# Patient Record
Sex: Female | Born: 1990
Health system: Southern US, Community
[De-identification: ages and names within clinical notes are randomized; demographics above are authoritative.]

## PROBLEM LIST (undated history)

## (undated) ENCOUNTER — Emergency Department (HOSPITAL_COMMUNITY): Admission: EM | Payer: Self-pay | Source: Home / Self Care

## (undated) DIAGNOSIS — E282 Polycystic ovarian syndrome: Secondary | ICD-10-CM

## (undated) DIAGNOSIS — C4491 Basal cell carcinoma of skin, unspecified: Secondary | ICD-10-CM

## (undated) HISTORY — DX: Polycystic ovarian syndrome: E28.2

## (undated) HISTORY — DX: Basal cell carcinoma of skin, unspecified: C44.91

---

## 2012-12-20 HISTORY — PX: BASAL CELL CARCINOMA EXCISION: SHX1214

## 2015-04-09 ENCOUNTER — Ambulatory Visit (INDEPENDENT_AMBULATORY_CARE_PROVIDER_SITE_OTHER): Admitting: Allergy and Immunology

## 2015-04-09 ENCOUNTER — Encounter: Payer: Self-pay | Admitting: Allergy and Immunology

## 2015-04-09 VITALS — BP 120/76 | HR 52 | Temp 97.9°F | Resp 12 | Ht 62.4 in | Wt 128.7 lb

## 2015-04-09 DIAGNOSIS — K219 Gastro-esophageal reflux disease without esophagitis: Secondary | ICD-10-CM

## 2015-04-09 DIAGNOSIS — J387 Other diseases of larynx: Secondary | ICD-10-CM | POA: Diagnosis not present

## 2015-04-09 DIAGNOSIS — J3089 Other allergic rhinitis: Secondary | ICD-10-CM

## 2015-04-09 MED ORDER — MONTELUKAST SODIUM 10 MG PO TABS: 10.0000 mg | ORAL_TABLET | Freq: Every day | ORAL | Status: DC

## 2015-04-09 MED ORDER — OMEPRAZOLE 40 MG PO CPDR: DELAYED_RELEASE_CAPSULE | ORAL | Status: DC

## 2015-04-09 MED ORDER — METHYLPREDNISOLONE ACETATE 80 MG/ML IJ SUSP
80.0000 mg | Freq: Once | INTRAMUSCULAR | Status: AC
  Administered 2015-04-09: 80 mg via INTRAMUSCULAR

## 2015-04-09 MED ORDER — RANITIDINE HCL 300 MG PO TABS: 300.0000 mg | ORAL_TABLET | Freq: Every day | ORAL | Status: DC

## 2015-04-09 NOTE — Progress Notes (Signed)
[place]    NEW PATIENT NOTE    Subjective:   Patient ID: April Woodard is a 24 y.o. female with a chief complaint of Sinus Problem  and the following problems:  HPI Comments:  April Woodard presents to this clinic with complaints of postnasal drip. She has a history of developing problems with her sinuses back many years. She underwent evaluation with an allergist back in 2011 was skin tested and found to be allergic to multiple allergens. At that point in time she had lots of cough and postnasal drip and some nasal issues. She apparently had a CT scan of her sinuses at that point in time which identified a left maxillary cyst. About 3 times a year she'll develop recurrent problems with some head congestion and postnasal drip and throat clearing. In August 2016 she's had persistent problems with postnasal drip and throat clearing something sticky stuff stuck in her throat and intermittent raspy voice. During one week she developed green nasal discharge. She did visit with Dr. Redmond Baseman who gave her some amoxicillin for 2 weeks ago which did help some of her green nasal discharge and maybe helps her cough slightly but really did not help her postnasal drip. She does not note any obvious provoking factor giving rise to these problems. She denies any classic reflux symptoms. She might have had reflux treatment back in 2011 which she's not sure really helped her. She has been using some over-the-counter Rhinocort and Claritin-D on occasion but rarely.   History reviewed. No pertinent past medical history.  History reviewed. No pertinent past surgical history.   Current outpatient prescriptions:  .  Loratadine-Pseudoephedrine (CLARITIN-D 24 HOUR PO), Take by mouth as needed., Disp: , Rfl:  .  Multiple Vitamin (MULTIVITAMIN) tablet, Take 1 tablet by mouth daily., Disp: , Rfl:   No orders of the defined types were placed in this encounter.     No Known Allergies  Review of Systems  Constitutional: Negative for fever, chills, weight loss and malaise/fatigue.  HENT: Negative for congestion, ear discharge, ear pain, hearing loss, nosebleeds, sore throat and tinnitus.   Eyes: Negative for blurred vision, pain, discharge and redness.  Respiratory: Negative for cough, hemoptysis, sputum production, shortness of breath, wheezing and stridor.   Cardiovascular: Negative for chest pain and leg swelling.  Gastrointestinal: Negative for heartburn, nausea, vomiting, abdominal pain and diarrhea.  Musculoskeletal: Negative for myalgias and joint pain.  Skin: Negative for itching and rash.  Neurological: Negative for dizziness and headaches.    Family History  Problem Relation Age of Onset  . Asthma Mother   . Multiple sclerosis Mother   . Allergic rhinitis Mother     Social History   Social History  . Marital Status: Single    Spouse Name: N/A  . Number of Children: N/A  . Years of Education: N/A   Occupational History  . Not on file.   Social History Main Topics  . Smoking status: Never Smoker   . Smokeless tobacco: Never Used  . Alcohol Use: Yes  . Drug Use: Not on file  . Sexual Activity: Not on file   Other Topics Concern  . Not on file   Social History Narrative  . No narrative on file    Environmental and Social history  April Woodard lives in an apartment with a slightly damp environment, no animals located inside the household, no carpeting in the bedroom, no smokers located inside the household,  and employment as a Optometrist.   Objective:   Filed Vitals:   04/09/15 1415  BP: 120/76  Pulse: 52  Temp: 97.9 F (36.6 C)  Resp: 12    Physical Exam  Constitutional: She is well-developed, well-nourished, and in no distress. No distress.  Raspy voice and throat clearing  HENT:  Head: Normocephalic.  Right Ear: Tympanic membrane and external ear normal.  Left Ear: Tympanic membrane and external ear  normal.  Nose: Nose normal. No mucosal edema, rhinorrhea, nose lacerations, sinus tenderness, septal deviation or nasal septal hematoma. No epistaxis.  No foreign bodies.  Mouth/Throat: Oropharynx is clear and moist. No oropharyngeal exudate.  Nasal crease  Eyes: Conjunctivae are normal. Pupils are equal, round, and reactive to light. Right eye exhibits no discharge. Left eye exhibits no discharge. No scleral icterus.  Neck: No tracheal deviation present. No thyromegaly present.  Cardiovascular: Normal rate, regular rhythm and normal heart sounds.  Exam reveals no gallop and no friction rub.   No murmur heard. Pulmonary/Chest: Effort normal and breath sounds normal. No stridor. No respiratory distress. She has no wheezes. She has no rales. She exhibits no tenderness.  Abdominal: Soft. She exhibits no distension and no mass. There is no tenderness. There is no rebound and no guarding.  Musculoskeletal: She exhibits no edema or tenderness.  Lymphadenopathy:    She has no cervical adenopathy.  Neurological: She is alert. Gait normal.  Skin: No rash noted. She is not diaphoretic. No erythema. No pallor.  Psychiatric: Mood and affect normal.    Diagnostics:  Allergy skin tests were performed. Percutaneous skin testing identified hypersensitivity against grasses and trees and weeds. Intradermal skin testing identified hypersensitivity against dust mite  Assessment and Plan:    1. Other allergic rhinitis   2. LPRD (laryngopharyngeal reflux disease)       1. Avoidance measures  2. Treat and prevent inflammation:   A. OTC Rhinocort one spray each nostril one time per day  B. Montelukast 10mg  one tablet one time per day  C. depomedrol 80mg  IM today  3. Treat reflux:   A. No caffeine and no chocolate and decrease alcohol 50%  B. Omeprazole 40 one tablet in AM  C. Ranitidine 300 on tablet in PM  4. If needed:   A. Nasal saline  B. OTC antihistamine  5. Return in 4 weeks.  6. Get  flu vaccine beginning of November.  We will start April Woodard on therapy for atopic disease by having her perform allergen avoidance measures and consistently using some anti-inflammatory medications. As well, we will treat her for reflux-induced respiratory disease with the use of a proton pump inhibitor and a H2 receptor blocker. I will regroup with her in 4 weeks and we'll make decision about how to proceed pending her response.     Allena Katz, MD [place]

## 2015-04-09 NOTE — Patient Instructions (Signed)
  1. Avoidance measures  2. Treat and prevent inflammation:   A. OTC Rhinocort one spray each nostril one time per day  B. Montelukast 10mg  one tablet one time per day  C. depomedrol 80mg  IM today  3. Treat reflux:   A. No caffeine and no chocolate and decrease alcohol 50%  B. Omeprazole 40 one tablet in AM  C. Ranitidine 300 on tablet in PM  4. If needed:   A. Nasal saline  B. OTC antihistamine  5. Return in 4 weeks.  Get flu vaccine beginning of November.

## 2015-05-08 ENCOUNTER — Encounter: Payer: Self-pay | Admitting: Allergy and Immunology

## 2015-05-08 ENCOUNTER — Ambulatory Visit (INDEPENDENT_AMBULATORY_CARE_PROVIDER_SITE_OTHER): Admitting: Allergy and Immunology

## 2015-05-08 VITALS — BP 110/70 | HR 76 | Resp 16

## 2015-05-08 DIAGNOSIS — J309 Allergic rhinitis, unspecified: Secondary | ICD-10-CM

## 2015-05-08 DIAGNOSIS — H1010 Acute atopic conjunctivitis, unspecified eye: Secondary | ICD-10-CM | POA: Insufficient documentation

## 2015-05-08 DIAGNOSIS — J387 Other diseases of larynx: Secondary | ICD-10-CM

## 2015-05-08 DIAGNOSIS — K219 Gastro-esophageal reflux disease without esophagitis: Secondary | ICD-10-CM | POA: Insufficient documentation

## 2015-05-08 NOTE — Patient Instructions (Signed)
  1. Avoidance measures  2. Treat and prevent inflammation:   A. Decrease OTC Rhinocort one spray each nostril three times per week  B. Montelukast 10mg  one tablet one time per day  3. Treat reflux:   A. No caffeine and no chocolate and decrease alcohol 50%  B. Omeprazole 40 one tablet in AM  C. Ranitidine 300 on tablet in PM  4. If needed:   A. Nasal saline  B. OTC antihistamine  5. Return in January 2017

## 2015-05-08 NOTE — Progress Notes (Signed)
[facility]  Follow-up Note  Refering Provider: No ref. provider found Primary Provider: No PCP Per Patient  Subjective:   April Woodard is a 24 y.o. female who returns to the [place] in re-evaluation of the following:  HPI Comments:  April Woodard presents to this clinic in reevaluation of her LPR and allergic rhinitis. She is much better. Her cough is basically gone. Her throat is much better. She still has a sensation of some postnasal drip on occasion. She's been very good about using medical therapy for the past 4 weeks which includes a combination of Rhinocort, montelukast, omeprazole, and ranitidine. She is decreased her alcohol by about 50% and does not consume any caffeine and chocolate at this point in time.   Outpatient Encounter Prescriptions as of 05/08/2015  Medication Sig  . budesonide (RHINOCORT ALLERGY) 32 MCG/ACT nasal spray Place 1 spray into both nostrils daily.  . montelukast (SINGULAIR) 10 MG tablet Take 1 tablet (10 mg total) by mouth daily.  . Multiple Vitamin (MULTIVITAMIN) tablet Take 1 tablet by mouth daily.  Marland Kitchen omeprazole (PRILOSEC) 40 MG capsule Take one capsule every morning before breakfast as directed.  . ranitidine (ZANTAC) 300 MG tablet Take 1 tablet (300 mg total) by mouth at bedtime.  . Loratadine-Pseudoephedrine (CLARITIN-D 24 HOUR PO) Take by mouth as needed.   No facility-administered encounter medications on file as of 05/08/2015.    No orders of the defined types were placed in this encounter.    No past medical history on file.  No past surgical history on file.  No Known Allergies  Review of Systems  Constitutional: Negative.   HENT: Negative.   Eyes: Negative.   Respiratory: Negative.   Cardiovascular: Negative.   Gastrointestinal: Negative.   Skin: Negative.      Objective:   Filed Vitals:   05/08/15 1040  BP: 110/70  Pulse: 76  Resp: 16         Physical Exam  Constitutional: She appears well-developed and well-nourished. No distress.  HENT:  Head: Normocephalic and atraumatic. Head is without right periorbital erythema and without left periorbital erythema.  Right Ear: Tympanic membrane, external ear and ear canal normal. No drainage or tenderness. No foreign bodies. Tympanic membrane is not injected, not scarred, not perforated, not erythematous, not retracted and not bulging. No middle ear effusion.  Left Ear: Tympanic membrane, external ear and ear canal normal. No drainage or tenderness. No foreign bodies. Tympanic membrane is not injected, not scarred, not perforated, not erythematous, not retracted and not bulging.  No middle ear effusion.  Nose: Nose normal. No mucosal edema, rhinorrhea, nose lacerations or sinus tenderness.  No foreign bodies.  Mouth/Throat: Oropharynx is clear and moist. No oropharyngeal exudate, posterior oropharyngeal edema, posterior oropharyngeal erythema or tonsillar abscesses.  Right septal excoriation with small bleed.  Eyes: Lids are normal. Right eye exhibits no chemosis, no discharge and no exudate. No foreign body present in the right eye. Left eye exhibits no chemosis, no discharge and no exudate. No foreign body present in the left eye. Right conjunctiva is not injected. Left conjunctiva is not injected.  Neck: Neck supple. No tracheal tenderness present. No tracheal deviation and no edema present. No thyroid mass and no thyromegaly present.  Cardiovascular: Normal rate, regular rhythm, S1 normal and S2 normal.  Exam reveals no gallop.   No murmur heard. Pulmonary/Chest: No accessory muscle usage or stridor. No respiratory distress. She has no  wheezes. She has no rhonchi. She has no rales.  Abdominal: Soft.  Lymphadenopathy:       Head (right side): No tonsillar adenopathy present.       Head (left side): No tonsillar adenopathy present.    She has no cervical adenopathy.  Neurological: She is  alert.  Skin: No rash noted. She is not diaphoretic.  Psychiatric: She has a normal mood and affect. Her behavior is normal.    Diagnostics: None  Assessment and Plan:   1. Allergic rhinoconjunctivitis   2. LPRD (laryngopharyngeal reflux disease)      1. Avoidance measures  2. Treat and prevent inflammation:   A. Decrease OTC Rhinocort one spray each nostril three times per week  B. Montelukast 10mg  one tablet one time per day  3. Treat reflux:   A. No caffeine and no chocolate and decrease alcohol 50%  B. Omeprazole 40 one tablet in AM  C. Ranitidine 300 on tablet in PM  4. If needed:   A. Nasal saline  B. OTC antihistamine  5. Return in January 2017  April Woodard has done quite well with her initial therapy and we will continue to have her use is large collection of medical treatment until we can see her back in this clinic in January 2017. If she continues to do well then we'll attempt to consolidate her medical therapy at that point in time. I did ask her to decrease her Rhinocort especially on her right nostril as this does appear to be precipitating some irritation in that area. I told her to eliminate Rhinocort and not nostril for at least a week and then restarted 3 times per week.      Allena Katz, MD [place]

## 2015-07-10 ENCOUNTER — Ambulatory Visit (INDEPENDENT_AMBULATORY_CARE_PROVIDER_SITE_OTHER): Admitting: Allergy and Immunology

## 2015-07-10 ENCOUNTER — Encounter: Payer: Self-pay | Admitting: Allergy and Immunology

## 2015-07-10 VITALS — BP 106/64 | HR 84 | Resp 16

## 2015-07-10 DIAGNOSIS — J387 Other diseases of larynx: Secondary | ICD-10-CM | POA: Diagnosis not present

## 2015-07-10 DIAGNOSIS — H1010 Acute atopic conjunctivitis, unspecified eye: Secondary | ICD-10-CM | POA: Diagnosis not present

## 2015-07-10 DIAGNOSIS — J309 Allergic rhinitis, unspecified: Secondary | ICD-10-CM

## 2015-07-10 DIAGNOSIS — K219 Gastro-esophageal reflux disease without esophagitis: Secondary | ICD-10-CM

## 2015-07-10 NOTE — Progress Notes (Signed)
[place] [facility]  Follow-up Note  Referring Provider: No ref. provider found Primary Provider: No PCP Per Patient Date of Office Visit: 07/10/2015  Subjective:   April Woodard is a 25 y.o. female who returns to the [place] in re-evaluation of the following:  HPI Comments: April Woodard presents to this clinic on 07/10/2015 in reevaluation of her allergic rhinoconjunctivitis and LPR. She has continued to do well regarding her LPR. She's been careful about alcohol and caffeine and chocolate and has noticed a rather dramatic improvement regarding her cough and throat clearing. Basically this is gone. Her nose is also improved with therapy directed at her atopic disease which includes allergen avoidance measures and consistently use of Rhinocort and montelukast. She did attempt to taper down her Rhinocort to 3 times per week during the last visit and she may have a little bit more stuffy nose and some postnasal drip since doing so. As well, she notes that she's been developing tonsilliths and she gets them out with a Q-tip. She is wondering what type of therapy can be done to eliminate this type of problem.   Current Outpatient Prescriptions on File Prior to Visit  Medication Sig Dispense Refill  . budesonide (RHINOCORT ALLERGY) 32 MCG/ACT nasal spray Place 1 spray into both nostrils daily.    . montelukast (SINGULAIR) 10 MG tablet Take 1 tablet (10 mg total) by mouth daily. 30 tablet 5  . Multiple Vitamin (MULTIVITAMIN) tablet Take 1 tablet by mouth daily.    Marland Kitchen omeprazole (PRILOSEC) 40 MG capsule Take one capsule every morning before breakfast as directed. 30 capsule 5  . ranitidine (ZANTAC) 300 MG tablet Take 1 tablet (300 mg total) by mouth at bedtime. 30 tablet 5  . Loratadine-Pseudoephedrine (CLARITIN-D 24 HOUR PO) Take by mouth as needed. Reported on 07/10/2015     No current facility-administered medications on file prior  to visit.    No orders of the defined types were placed in this encounter.    No past medical history on file.  No past surgical history on file.  No Known Allergies  Review of systems negative except as noted in HPI / PMHx or noted below:  Review of Systems  Constitutional: Negative.   HENT: Negative.   Eyes: Negative.   Respiratory: Negative.   Cardiovascular: Negative.   Gastrointestinal: Negative.   Genitourinary: Negative.   Musculoskeletal: Negative.   Skin: Negative.   Neurological: Negative.   Endo/Heme/Allergies: Negative.   Psychiatric/Behavioral: Negative.      Objective:   Filed Vitals:   07/10/15 1021  BP: 106/64  Pulse: 84  Resp: 16          Physical Exam  Constitutional: She is well-developed, well-nourished, and in no distress. No distress.  HENT:  Head: Normocephalic.  Right Ear: Tympanic membrane, external ear and ear canal normal.  Left Ear: Tympanic membrane, external ear and ear canal normal.  Nose: Nose normal. No mucosal edema or rhinorrhea.  Mouth/Throat: Uvula is midline, oropharynx is clear and moist and mucous membranes are normal. No oropharyngeal exudate.  Eyes: Conjunctivae are normal.  Neck: Trachea normal. No tracheal tenderness present. No tracheal deviation present. No thyromegaly present.  Cardiovascular: Normal rate, regular rhythm, S1 normal, S2 normal and normal heart sounds.   No murmur heard. Pulmonary/Chest: Breath sounds normal. No stridor. No respiratory distress. She has no wheezes. She has no rales.  Musculoskeletal: She exhibits no edema.  Lymphadenopathy:  Head (right side): No tonsillar adenopathy present.       Head (left side): No tonsillar adenopathy present.    She has no cervical adenopathy.    She has no axillary adenopathy.  Neurological: She is alert. Gait normal.  Skin: No rash noted. She is not diaphoretic. No erythema. Nails show no clubbing.  Psychiatric: Mood and affect normal.     Diagnostics: None  Assessment and Plan:   1. Allergic rhinoconjunctivitis   2. LPRD (laryngopharyngeal reflux disease)     1. Attempt to discontinue ranitidine  2. Treat and prevent inflammation:   A. OTC Rhinocort one spray each nostril 3-7 times per week  B. Montelukast 10mg  one tablet one time per day  3. Treat reflux:   A. No caffeine and no chocolate and decrease alcohol 50%  B. Omeprazole 40 one tablet in AM  4. If needed:   A. Nasal saline  B. OTC antihistamine  5. Return in 3 months or earlier if problem.  6. ENT for tonsilliths?  Overall April Woodard is doing better in regard to both her allergic rhinoconjunctivitis and LPR and she has the option of manipulating her Rhinocort dose depending on the effects she would like to receive and will make an attempt to consolidate her ranitidine during today's visit. Certainly she develops more problems with her throat as she stops her ranitidine she'll need to restart this medication. I did have a talk with her today about how to approach her tonsil issue and certainly she can go visit with an ear nose and throat physician to see if she would qualify for a tonsillectomy but it may just be best to remove the tonsil material once a week using a Q-tip.    Allena Katz, MD [place]

## 2015-07-10 NOTE — Patient Instructions (Signed)
  1. Attempt to discontinue ranitidine  2. Treat and prevent inflammation:   A. OTC Rhinocort one spray each nostril 3-7 times per week  B. Montelukast 10mg  one tablet one time per day  3. Treat reflux:   A. No caffeine and no chocolate and decrease alcohol 50%  B. Omeprazole 40 one tablet in AM  4. If needed:   A. Nasal saline  B. OTC antihistamine  5. Return in 3 months or earlier if problem.  6. ENT for tonsilliths?

## 2015-07-22 ENCOUNTER — Telehealth: Payer: Self-pay

## 2015-07-22 NOTE — Telephone Encounter (Signed)
Patient was seen on 07/10/2015 by Dr.Kozlow, she was sick and getting better, but it seems like she is going back to how she was. Patient went to urgent care on yesterday and was given Augmentin. She is waking up with her left eye stuck together and a lot of pressure on her left side. Also having drainage down her throat making her cough. She thinks it is a sinus infection and is wondering what Dr. Neldon Mc thinks she should do.   Please Advise

## 2015-07-22 NOTE — Telephone Encounter (Signed)
Spoke to patient and informed her to continue with the Augmentin that she got on yesterday.  I informed her to call the office if she is not feeling better by the end of the week.

## 2015-10-08 ENCOUNTER — Encounter: Payer: Self-pay | Admitting: Allergy and Immunology

## 2015-10-08 ENCOUNTER — Ambulatory Visit (INDEPENDENT_AMBULATORY_CARE_PROVIDER_SITE_OTHER): Admitting: Allergy and Immunology

## 2015-10-08 VITALS — BP 110/78 | HR 80 | Resp 16

## 2015-10-08 DIAGNOSIS — J387 Other diseases of larynx: Secondary | ICD-10-CM

## 2015-10-08 DIAGNOSIS — J309 Allergic rhinitis, unspecified: Secondary | ICD-10-CM | POA: Diagnosis not present

## 2015-10-08 DIAGNOSIS — K219 Gastro-esophageal reflux disease without esophagitis: Secondary | ICD-10-CM

## 2015-10-08 DIAGNOSIS — H1010 Acute atopic conjunctivitis, unspecified eye: Secondary | ICD-10-CM | POA: Diagnosis not present

## 2015-10-08 NOTE — Progress Notes (Signed)
Follow-up Note  Referring Provider: No ref. provider found Primary Provider: No PCP Per Patient Date of Office Visit: 10/08/2015  Subjective:   April Woodard (DOB: [birth year]) is a 25 y.o. female who returns to the [facility] on 10/08/2015 in re-evaluation of the following:  HPI: April Woodard returns to this clinic in reevaluation of her allergic rhinoconjunctivitis and LPR. Overall she is done quite well since her last visit in this clinic in January 2017 while using Rhinocort a few times per week and montelukast on most days. She has tapered off her omeprazole. She is somewhat careful about caffeine and chocolate and alcohol consumption yet still continues to consume these food products and liquids. She still has intermittent issues with her allergies and her throat. She'll have intermittent postnasal drip and a coughing spell and an occasional irritated swollen eye and some nasal congestion but overall she thinks she is doing okay on her current medical therapy. She did inquire about immunotherapy and the role of this procedure regarding her atopic disease.    Medication List           CLARITIN-D 24 HOUR PO  Take by mouth as needed. Reported on 10/08/2015     loratadine 10 MG tablet  Commonly known as:  CLARITIN  Take 10 mg by mouth daily.     montelukast 10 MG tablet  Commonly known as:  SINGULAIR  Take 1 tablet (10 mg total) by mouth daily.     multivitamin tablet  Take 1 tablet by mouth daily.     omeprazole 40 MG capsule  Commonly known as:  PRILOSEC  Take one capsule every morning before breakfast as directed.     ranitidine 300 MG tablet  Commonly known as:  ZANTAC  Take 1 tablet (300 mg total) by mouth at bedtime.     RHINOCORT ALLERGY 32 MCG/ACT nasal spray  Generic drug:  budesonide  Place 1 spray into both nostrils daily.        History reviewed. No pertinent past medical history.  History reviewed. No pertinent past surgical  history.  No Known Allergies  Review of systems negative except as noted in HPI / PMHx or noted below:  Review of Systems  Constitutional: Negative.   HENT: Negative.   Eyes: Negative.   Respiratory: Negative.   Cardiovascular: Negative.   Gastrointestinal: Negative.   Genitourinary: Negative.   Musculoskeletal: Negative.   Skin: Negative.   Neurological: Negative.   Endo/Heme/Allergies: Negative.   Psychiatric/Behavioral: Negative.      Objective:   Filed Vitals:   10/08/15 0810  BP: 110/78  Pulse: 80  Resp: 16          Physical Exam  Constitutional: She is well-developed, well-nourished, and in no distress.  HENT:  Head: Normocephalic.  Right Ear: Tympanic membrane, external ear and ear canal normal.  Left Ear: Tympanic membrane, external ear and ear canal normal.  Nose: Nose normal. No mucosal edema or rhinorrhea.  Mouth/Throat: Uvula is midline, oropharynx is clear and moist and mucous membranes are normal. No oropharyngeal exudate.  Eyes: Conjunctivae are normal.  Neck: Trachea normal. No tracheal tenderness present. No tracheal deviation present. No thyromegaly present.  Cardiovascular: Normal rate, regular rhythm, S1 normal, S2 normal and normal heart sounds.   No murmur heard. Pulmonary/Chest: Breath sounds normal. No stridor. No respiratory distress. She has no wheezes. She has no rales.  Musculoskeletal: She exhibits no edema.  Lymphadenopathy:       Head (  right side): No tonsillar adenopathy present.       Head (left side): No tonsillar adenopathy present.    She has no cervical adenopathy.  Neurological: She is alert. Gait normal.  Skin: No rash noted. She is not diaphoretic. No erythema. Nails show no clubbing.  Psychiatric: Mood and affect normal.    Diagnostics: None     Assessment and Plan:   1. Allergic rhinoconjunctivitis   2. LPRD (laryngopharyngeal reflux disease)     1. Consider immunotherapy  2. Treat and prevent  inflammation:   A. OTC Rhinocort one spray each nostril 3-7 times per week  B. Montelukast 10mg  one tablet one time per day  3. Treat reflux:   A. No caffeine and no chocolate and decrease alcohol 50%  B. Can restart Omeprazole 40 one tablet in AM  4. If needed:   A. Nasal saline  B. OTC antihistamine  5. Return in one year or earlier if problem.   [name] has done relatively well on her current medical therapy and she appears to understand how these medications work and the appropriate use of these medications. I left it up to [facility] to make a decision about the dose of Rhinocort that she requires and whether or not she needs to restart the omeprazole should she develop significant problems consistent with LPR as he moves forward. As well, I did give her literature on immunotherapy during today's visit as she is presently considering starting this form of treatment. We will see her back in this clinic in 1 year or earlier if there is a problem.  Allena Katz, MD [place]

## 2015-10-08 NOTE — Patient Instructions (Signed)
  1. Consider immunotherapy  2. Treat and prevent inflammation:   A. OTC Rhinocort one spray each nostril 3-7 times per week  B. Montelukast 10mg  one tablet one time per day  3. Treat reflux:   A. No caffeine and no chocolate and decrease alcohol 50%  B. Can restart Omeprazole 40 one tablet in AM  4. If needed:   A. Nasal saline  B. OTC antihistamine  5. Return in one year or earlier if problem.

## 2015-10-21 ENCOUNTER — Encounter (HOSPITAL_COMMUNITY): Payer: Self-pay | Admitting: Emergency Medicine

## 2015-10-21 ENCOUNTER — Emergency Department (HOSPITAL_COMMUNITY)
Admission: EM | Admit: 2015-10-21 | Discharge: 2015-10-21 | Disposition: A | Discharge status: Home / Self Care | Attending: Emergency Medicine | Admitting: Emergency Medicine

## 2015-10-21 DIAGNOSIS — M545 Low back pain: Secondary | ICD-10-CM | POA: Insufficient documentation

## 2015-10-21 DIAGNOSIS — R51 Headache: Secondary | ICD-10-CM | POA: Insufficient documentation

## 2015-10-21 DIAGNOSIS — R202 Paresthesia of skin: Secondary | ICD-10-CM | POA: Insufficient documentation

## 2015-10-21 LAB — CBC
HEMATOCRIT: 37.8 % (ref 36.0–46.0)
HEMOGLOBIN: 12.8 g/dL (ref 12.0–15.0)
MCH: 32.1 pg (ref 26.0–34.0)
MCHC: 33.9 g/dL (ref 30.0–36.0)
MCV: 94.7 fL (ref 78.0–100.0)
Platelets: 339 10*3/uL (ref 150–400)
RBC: 3.99 MIL/uL (ref 3.87–5.11)
RDW: 11.9 % (ref 11.5–15.5)
WBC: 7.6 10*3/uL (ref 4.0–10.5)

## 2015-10-21 LAB — BASIC METABOLIC PANEL
ANION GAP: 12 (ref 5–15)
BUN: 7 mg/dL (ref 6–20)
CHLORIDE: 98 mmol/L — AB (ref 101–111)
CO2: 24 mmol/L (ref 22–32)
Calcium: 9.4 mg/dL (ref 8.9–10.3)
Creatinine, Ser: 0.75 mg/dL (ref 0.44–1.00)
GFR calc Af Amer: >60 mL/min (ref >60)
GFR calc non Af Amer: >60 mL/min (ref >60)
GLUCOSE: 90 mg/dL (ref 65–99)
POTASSIUM: 3.3 mmol/L — AB (ref 3.5–5.1)
Sodium: 134 mmol/L — ABNORMAL LOW (ref 135–145)

## 2015-10-21 LAB — CBG MONITORING, ED: Glucose-Capillary: 84 mg/dL (ref 65–99)

## 2015-10-21 LAB — URINALYSIS, ROUTINE W REFLEX MICROSCOPIC
BILIRUBIN URINE: NEGATIVE
Glucose, UA: NEGATIVE mg/dL
HGB URINE DIPSTICK: NEGATIVE
KETONES UR: NEGATIVE mg/dL
LEUKOCYTES UA: NEGATIVE
NITRITE: NEGATIVE
PROTEIN: NEGATIVE mg/dL
Specific Gravity, Urine: 1.004 — ABNORMAL LOW (ref 1.005–1.030)
pH: 7 (ref 5.0–8.0)

## 2015-10-21 NOTE — ED Notes (Signed)
Pt states since yesterday she has felt "foggy headed" and today she has pressure in the left side of her head into her left jaw. Pt also states that this morning she had a pain in her mid right side of back. Pt is tearful in triage and states she just feels anxious. Pt reports a 4/10 headache at this time.  Pt reports feeling a tingling sensation only in left fingers. Grip strengths equal. No other neuro deficits noted.

## 2015-10-21 NOTE — ED Notes (Signed)
Pt at desk stating she is leaving due to wait, encouraged to stay  

## 2015-10-22 ENCOUNTER — Other Ambulatory Visit: Payer: Self-pay | Admitting: Allergy and Immunology

## 2015-11-19 ENCOUNTER — Telehealth: Payer: Self-pay

## 2015-11-19 NOTE — Telephone Encounter (Signed)
PT CALLED AND WANTS TO START ALLERGY INJECTIONS I PUT HER ON SCHEDULE FOR June 19, @2PM  TO START INJECTIONS IF YOU CAN PLEASE ORDER THEM. [place]

## 2015-11-21 ENCOUNTER — Other Ambulatory Visit: Payer: Self-pay | Admitting: Allergy and Immunology

## 2015-11-21 DIAGNOSIS — H1010 Acute atopic conjunctivitis, unspecified eye: Secondary | ICD-10-CM

## 2015-11-21 DIAGNOSIS — J309 Allergic rhinitis, unspecified: Principal | ICD-10-CM

## 2015-11-27 DIAGNOSIS — J301 Allergic rhinitis due to pollen: Secondary | ICD-10-CM | POA: Diagnosis not present

## 2015-11-28 DIAGNOSIS — J3089 Other allergic rhinitis: Secondary | ICD-10-CM | POA: Diagnosis not present

## 2015-12-09 ENCOUNTER — Other Ambulatory Visit: Payer: Self-pay

## 2015-12-09 ENCOUNTER — Ambulatory Visit (INDEPENDENT_AMBULATORY_CARE_PROVIDER_SITE_OTHER)

## 2015-12-09 DIAGNOSIS — J309 Allergic rhinitis, unspecified: Secondary | ICD-10-CM | POA: Diagnosis not present

## 2015-12-09 MED ORDER — EPINEPHRINE 0.3 MG/0.3ML IJ SOAJ: INTRAMUSCULAR | Status: DC

## 2015-12-13 ENCOUNTER — Ambulatory Visit (INDEPENDENT_AMBULATORY_CARE_PROVIDER_SITE_OTHER)

## 2015-12-13 DIAGNOSIS — J309 Allergic rhinitis, unspecified: Secondary | ICD-10-CM

## 2015-12-17 ENCOUNTER — Ambulatory Visit (INDEPENDENT_AMBULATORY_CARE_PROVIDER_SITE_OTHER)

## 2015-12-17 DIAGNOSIS — J309 Allergic rhinitis, unspecified: Secondary | ICD-10-CM

## 2015-12-20 ENCOUNTER — Ambulatory Visit (INDEPENDENT_AMBULATORY_CARE_PROVIDER_SITE_OTHER): Admitting: *Deleted

## 2015-12-20 DIAGNOSIS — J309 Allergic rhinitis, unspecified: Secondary | ICD-10-CM | POA: Diagnosis not present

## 2015-12-26 ENCOUNTER — Ambulatory Visit (INDEPENDENT_AMBULATORY_CARE_PROVIDER_SITE_OTHER)

## 2015-12-26 DIAGNOSIS — J309 Allergic rhinitis, unspecified: Secondary | ICD-10-CM

## 2015-12-30 ENCOUNTER — Ambulatory Visit (INDEPENDENT_AMBULATORY_CARE_PROVIDER_SITE_OTHER): Admitting: *Deleted

## 2015-12-30 DIAGNOSIS — J309 Allergic rhinitis, unspecified: Secondary | ICD-10-CM | POA: Diagnosis not present

## 2016-01-03 ENCOUNTER — Ambulatory Visit (INDEPENDENT_AMBULATORY_CARE_PROVIDER_SITE_OTHER): Admitting: *Deleted

## 2016-01-03 DIAGNOSIS — J309 Allergic rhinitis, unspecified: Secondary | ICD-10-CM | POA: Diagnosis not present

## 2016-01-06 ENCOUNTER — Ambulatory Visit (INDEPENDENT_AMBULATORY_CARE_PROVIDER_SITE_OTHER): Admitting: *Deleted

## 2016-01-06 DIAGNOSIS — J309 Allergic rhinitis, unspecified: Secondary | ICD-10-CM

## 2016-01-08 ENCOUNTER — Ambulatory Visit (INDEPENDENT_AMBULATORY_CARE_PROVIDER_SITE_OTHER): Admitting: *Deleted

## 2016-01-08 DIAGNOSIS — J309 Allergic rhinitis, unspecified: Secondary | ICD-10-CM

## 2016-01-14 ENCOUNTER — Ambulatory Visit (INDEPENDENT_AMBULATORY_CARE_PROVIDER_SITE_OTHER): Admitting: *Deleted

## 2016-01-14 DIAGNOSIS — J309 Allergic rhinitis, unspecified: Secondary | ICD-10-CM

## 2016-01-23 ENCOUNTER — Ambulatory Visit (INDEPENDENT_AMBULATORY_CARE_PROVIDER_SITE_OTHER)

## 2016-01-23 DIAGNOSIS — J309 Allergic rhinitis, unspecified: Secondary | ICD-10-CM

## 2016-01-28 ENCOUNTER — Ambulatory Visit (INDEPENDENT_AMBULATORY_CARE_PROVIDER_SITE_OTHER): Admitting: *Deleted

## 2016-01-28 DIAGNOSIS — J309 Allergic rhinitis, unspecified: Secondary | ICD-10-CM

## 2016-01-31 ENCOUNTER — Ambulatory Visit (INDEPENDENT_AMBULATORY_CARE_PROVIDER_SITE_OTHER): Admitting: *Deleted

## 2016-01-31 DIAGNOSIS — J309 Allergic rhinitis, unspecified: Secondary | ICD-10-CM

## 2016-02-05 ENCOUNTER — Ambulatory Visit (INDEPENDENT_AMBULATORY_CARE_PROVIDER_SITE_OTHER): Admitting: *Deleted

## 2016-02-05 DIAGNOSIS — J309 Allergic rhinitis, unspecified: Secondary | ICD-10-CM

## 2016-02-07 ENCOUNTER — Ambulatory Visit (INDEPENDENT_AMBULATORY_CARE_PROVIDER_SITE_OTHER)

## 2016-02-07 DIAGNOSIS — J309 Allergic rhinitis, unspecified: Secondary | ICD-10-CM

## 2016-02-12 ENCOUNTER — Ambulatory Visit (INDEPENDENT_AMBULATORY_CARE_PROVIDER_SITE_OTHER): Admitting: *Deleted

## 2016-02-12 DIAGNOSIS — J309 Allergic rhinitis, unspecified: Secondary | ICD-10-CM | POA: Diagnosis not present

## 2016-02-14 ENCOUNTER — Ambulatory Visit (INDEPENDENT_AMBULATORY_CARE_PROVIDER_SITE_OTHER)

## 2016-02-14 DIAGNOSIS — J309 Allergic rhinitis, unspecified: Secondary | ICD-10-CM | POA: Diagnosis not present

## 2016-02-17 ENCOUNTER — Ambulatory Visit (INDEPENDENT_AMBULATORY_CARE_PROVIDER_SITE_OTHER)

## 2016-02-17 DIAGNOSIS — J309 Allergic rhinitis, unspecified: Secondary | ICD-10-CM

## 2016-02-20 ENCOUNTER — Ambulatory Visit (INDEPENDENT_AMBULATORY_CARE_PROVIDER_SITE_OTHER): Admitting: *Deleted

## 2016-02-20 DIAGNOSIS — J309 Allergic rhinitis, unspecified: Secondary | ICD-10-CM

## 2016-02-28 ENCOUNTER — Ambulatory Visit (INDEPENDENT_AMBULATORY_CARE_PROVIDER_SITE_OTHER)

## 2016-02-28 DIAGNOSIS — J309 Allergic rhinitis, unspecified: Secondary | ICD-10-CM | POA: Diagnosis not present

## 2016-03-03 ENCOUNTER — Ambulatory Visit (INDEPENDENT_AMBULATORY_CARE_PROVIDER_SITE_OTHER): Admitting: *Deleted

## 2016-03-03 DIAGNOSIS — J309 Allergic rhinitis, unspecified: Secondary | ICD-10-CM

## 2016-03-10 ENCOUNTER — Ambulatory Visit (INDEPENDENT_AMBULATORY_CARE_PROVIDER_SITE_OTHER)

## 2016-03-10 DIAGNOSIS — J309 Allergic rhinitis, unspecified: Secondary | ICD-10-CM | POA: Diagnosis not present

## 2016-03-17 ENCOUNTER — Ambulatory Visit (INDEPENDENT_AMBULATORY_CARE_PROVIDER_SITE_OTHER)

## 2016-03-17 DIAGNOSIS — J309 Allergic rhinitis, unspecified: Secondary | ICD-10-CM

## 2016-03-25 ENCOUNTER — Ambulatory Visit (INDEPENDENT_AMBULATORY_CARE_PROVIDER_SITE_OTHER)

## 2016-03-25 DIAGNOSIS — J309 Allergic rhinitis, unspecified: Secondary | ICD-10-CM | POA: Diagnosis not present

## 2016-03-29 ENCOUNTER — Emergency Department (HOSPITAL_COMMUNITY)

## 2016-03-29 ENCOUNTER — Encounter (HOSPITAL_COMMUNITY): Payer: Self-pay | Admitting: Emergency Medicine

## 2016-03-29 ENCOUNTER — Emergency Department (HOSPITAL_COMMUNITY)
Admission: EM | Admit: 2016-03-29 | Discharge: 2016-03-29 | Disposition: A | Discharge status: Home / Self Care | Attending: Emergency Medicine | Admitting: Emergency Medicine

## 2016-03-29 DIAGNOSIS — Y999 Unspecified external cause status: Secondary | ICD-10-CM | POA: Diagnosis not present

## 2016-03-29 DIAGNOSIS — Y939 Activity, unspecified: Secondary | ICD-10-CM | POA: Diagnosis not present

## 2016-03-29 DIAGNOSIS — S8011XA Contusion of right lower leg, initial encounter: Secondary | ICD-10-CM | POA: Insufficient documentation

## 2016-03-29 DIAGNOSIS — Y92410 Unspecified street and highway as the place of occurrence of the external cause: Secondary | ICD-10-CM | POA: Insufficient documentation

## 2016-03-29 DIAGNOSIS — S8991XA Unspecified injury of right lower leg, initial encounter: Secondary | ICD-10-CM | POA: Diagnosis present

## 2016-03-29 DIAGNOSIS — S20312A Abrasion of left front wall of thorax, initial encounter: Secondary | ICD-10-CM | POA: Insufficient documentation

## 2016-03-29 DIAGNOSIS — T07XXXA Unspecified multiple injuries, initial encounter: Secondary | ICD-10-CM

## 2016-03-29 MED ORDER — IBUPROFEN 200 MG PO TABS
600.0000 mg | ORAL_TABLET | Freq: Once | ORAL | Status: AC
  Administered 2016-03-29: 600 mg via ORAL
  Filled 2016-03-29: qty 3
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
## 2016-04-03 ENCOUNTER — Ambulatory Visit (INDEPENDENT_AMBULATORY_CARE_PROVIDER_SITE_OTHER)

## 2016-04-03 DIAGNOSIS — J019 Acute sinusitis, unspecified: Secondary | ICD-10-CM

## 2016-04-10 ENCOUNTER — Ambulatory Visit (INDEPENDENT_AMBULATORY_CARE_PROVIDER_SITE_OTHER)

## 2016-04-10 DIAGNOSIS — J019 Acute sinusitis, unspecified: Secondary | ICD-10-CM

## 2016-04-13 DIAGNOSIS — J3089 Other allergic rhinitis: Secondary | ICD-10-CM | POA: Diagnosis not present

## 2016-04-14 DIAGNOSIS — J301 Allergic rhinitis due to pollen: Secondary | ICD-10-CM | POA: Diagnosis not present

## 2016-04-15 ENCOUNTER — Ambulatory Visit (INDEPENDENT_AMBULATORY_CARE_PROVIDER_SITE_OTHER)

## 2016-04-15 DIAGNOSIS — J309 Allergic rhinitis, unspecified: Secondary | ICD-10-CM

## 2016-04-23 ENCOUNTER — Ambulatory Visit (INDEPENDENT_AMBULATORY_CARE_PROVIDER_SITE_OTHER): Admitting: *Deleted

## 2016-04-23 DIAGNOSIS — J309 Allergic rhinitis, unspecified: Secondary | ICD-10-CM

## 2016-04-28 ENCOUNTER — Ambulatory Visit (INDEPENDENT_AMBULATORY_CARE_PROVIDER_SITE_OTHER)

## 2016-04-28 DIAGNOSIS — J309 Allergic rhinitis, unspecified: Secondary | ICD-10-CM

## 2016-05-05 ENCOUNTER — Ambulatory Visit (INDEPENDENT_AMBULATORY_CARE_PROVIDER_SITE_OTHER): Admitting: *Deleted

## 2016-05-05 DIAGNOSIS — J309 Allergic rhinitis, unspecified: Secondary | ICD-10-CM | POA: Diagnosis not present

## 2016-05-11 ENCOUNTER — Ambulatory Visit (INDEPENDENT_AMBULATORY_CARE_PROVIDER_SITE_OTHER): Admitting: *Deleted

## 2016-05-11 DIAGNOSIS — J309 Allergic rhinitis, unspecified: Secondary | ICD-10-CM

## 2016-05-20 ENCOUNTER — Ambulatory Visit (INDEPENDENT_AMBULATORY_CARE_PROVIDER_SITE_OTHER): Admitting: *Deleted

## 2016-05-20 DIAGNOSIS — J309 Allergic rhinitis, unspecified: Secondary | ICD-10-CM

## 2016-05-25 ENCOUNTER — Ambulatory Visit (INDEPENDENT_AMBULATORY_CARE_PROVIDER_SITE_OTHER): Admitting: *Deleted

## 2016-05-25 DIAGNOSIS — J309 Allergic rhinitis, unspecified: Secondary | ICD-10-CM | POA: Diagnosis not present

## 2016-06-05 ENCOUNTER — Ambulatory Visit (INDEPENDENT_AMBULATORY_CARE_PROVIDER_SITE_OTHER): Admitting: *Deleted

## 2016-06-05 DIAGNOSIS — J309 Allergic rhinitis, unspecified: Secondary | ICD-10-CM | POA: Diagnosis not present

## 2016-06-12 ENCOUNTER — Ambulatory Visit (INDEPENDENT_AMBULATORY_CARE_PROVIDER_SITE_OTHER)

## 2016-06-12 DIAGNOSIS — J309 Allergic rhinitis, unspecified: Secondary | ICD-10-CM | POA: Diagnosis not present

## 2016-06-25 ENCOUNTER — Ambulatory Visit (INDEPENDENT_AMBULATORY_CARE_PROVIDER_SITE_OTHER): Payer: 59

## 2016-06-25 DIAGNOSIS — J309 Allergic rhinitis, unspecified: Secondary | ICD-10-CM

## 2016-07-02 ENCOUNTER — Ambulatory Visit (INDEPENDENT_AMBULATORY_CARE_PROVIDER_SITE_OTHER): Payer: 59

## 2016-07-02 DIAGNOSIS — J309 Allergic rhinitis, unspecified: Secondary | ICD-10-CM | POA: Diagnosis not present

## 2016-07-03 DIAGNOSIS — J301 Allergic rhinitis due to pollen: Secondary | ICD-10-CM | POA: Diagnosis not present

## 2016-07-06 DIAGNOSIS — J3089 Other allergic rhinitis: Secondary | ICD-10-CM | POA: Diagnosis not present

## 2016-07-07 ENCOUNTER — Ambulatory Visit (INDEPENDENT_AMBULATORY_CARE_PROVIDER_SITE_OTHER): Payer: 59 | Admitting: *Deleted

## 2016-07-07 DIAGNOSIS — J309 Allergic rhinitis, unspecified: Secondary | ICD-10-CM | POA: Diagnosis not present

## 2016-07-16 ENCOUNTER — Ambulatory Visit (INDEPENDENT_AMBULATORY_CARE_PROVIDER_SITE_OTHER): Payer: 59

## 2016-07-16 DIAGNOSIS — J309 Allergic rhinitis, unspecified: Secondary | ICD-10-CM | POA: Diagnosis not present

## 2016-07-23 ENCOUNTER — Ambulatory Visit (INDEPENDENT_AMBULATORY_CARE_PROVIDER_SITE_OTHER): Payer: 59

## 2016-07-23 DIAGNOSIS — J309 Allergic rhinitis, unspecified: Secondary | ICD-10-CM

## 2016-07-27 ENCOUNTER — Encounter: Payer: Self-pay | Admitting: Nurse Practitioner

## 2016-07-27 ENCOUNTER — Ambulatory Visit (INDEPENDENT_AMBULATORY_CARE_PROVIDER_SITE_OTHER): Payer: 59 | Admitting: Nurse Practitioner

## 2016-07-27 ENCOUNTER — Other Ambulatory Visit (INDEPENDENT_AMBULATORY_CARE_PROVIDER_SITE_OTHER): Payer: 59

## 2016-07-27 VITALS — BP 118/78 | HR 70 | Temp 97.8°F | Wt 138.0 lb

## 2016-07-27 DIAGNOSIS — E878 Other disorders of electrolyte and fluid balance, not elsewhere classified: Secondary | ICD-10-CM

## 2016-07-27 DIAGNOSIS — Z136 Encounter for screening for cardiovascular disorders: Secondary | ICD-10-CM

## 2016-07-27 DIAGNOSIS — Z13220 Encounter for screening for lipoid disorders: Secondary | ICD-10-CM

## 2016-07-27 DIAGNOSIS — F419 Anxiety disorder, unspecified: Secondary | ICD-10-CM

## 2016-07-27 DIAGNOSIS — Z23 Encounter for immunization: Secondary | ICD-10-CM | POA: Diagnosis not present

## 2016-07-27 DIAGNOSIS — M79671 Pain in right foot: Secondary | ICD-10-CM

## 2016-07-27 DIAGNOSIS — Z0000 Encounter for general adult medical examination without abnormal findings: Secondary | ICD-10-CM | POA: Diagnosis not present

## 2016-07-27 LAB — BASIC METABOLIC PANEL
BUN: 9 mg/dL (ref 6–23)
CALCIUM: 9.3 mg/dL (ref 8.4–10.5)
CHLORIDE: 106 meq/L (ref 96–112)
CO2: 29 meq/L (ref 19–32)
CREATININE: 0.68 mg/dL (ref 0.40–1.20)
GFR: 111.12 mL/min (ref >60.00)
GLUCOSE: 90 mg/dL (ref 70–99)
Potassium: 3.9 mEq/L (ref 3.5–5.1)
Sodium: 140 mEq/L (ref 135–145)

## 2016-07-27 LAB — LIPID PANEL
CHOL/HDL RATIO: 3
CHOLESTEROL: 194 mg/dL (ref 0–200)
HDL: 76.4 mg/dL (ref >39.00)
LDL CALC: 107 mg/dL — AB (ref 0–99)
NonHDL: 117.37
Triglycerides: 51 mg/dL (ref 0.0–149.0)
VLDL: 10.2 mg/dL (ref 0.0–40.0)

## 2016-07-27 LAB — TSH: TSH: 0.97 u[IU]/mL (ref 0.35–4.50)

## 2016-07-27 MED ORDER — NAPROXEN 500 MG PO TABS: 500.0000 mg | ORAL_TABLET | Freq: Two times a day (BID) | ORAL | 0 refills | Status: DC | PRN
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
## 2016-07-27 NOTE — Patient Instructions (Addendum)
Declined referral to podiatry at this time.  Use cold compress after running and foot stretches.  Please sign medical release to obtain records from [facility]  Plantar Fasciitis Rehab Ask your health care provider which exercises are safe for you. Do exercises exactly as told by your health care provider and adjust them as directed. It is normal to feel mild stretching, pulling, tightness, or discomfort as you do these exercises, but you should stop right away if you feel sudden pain or your pain gets worse. Do not begin these exercises until told by your health care provider. Stretching and range of motion exercises These exercises warm up your muscles and joints and improve the movement and flexibility of your foot. These exercises also help to relieve pain. Exercise A: Plantar fascia stretch 1. Sit with your left / right leg crossed over your opposite knee. 2. Hold your heel with one hand with that thumb near your arch. With your other hand, hold your toes and gently pull them back toward the top of your foot. You should feel a stretch on the bottom of your toes or your foot or both. 3. Hold this stretch for__________ seconds. 4. Slowly release your toes and return to the starting position. Repeat __________ times. Complete this exercise __________ times a day. Exercise B: Gastroc, standing 1. Stand with your hands against a wall. 2. Extend your left / right leg behind you, and bend your front knee slightly. 3. Keeping your heels on the floor and keeping your back knee straight, shift your weight toward the wall without arching your back. You should feel a gentle stretch in your left / right calf. 4. Hold this position for __________ seconds. Repeat __________ times. Complete this exercise __________ times a day. Exercise C: Soleus, standing 1. Stand with your hands against a wall. 2. Extend your left / right leg behind you, and bend your front knee slightly. 3. Keeping your  heels on the floor, bend your back knee and slightly shift your weight over the back leg. You should feel a gentle stretch deep in your calf. 4. Hold this position for __________ seconds. Repeat __________ times. Complete this exercise __________ times a day. Exercise D: Gastrocsoleus, standing 1. Stand with the ball of your left / right foot on a step. The ball of your foot is on the walking surface, right under your toes. 2. Keep your other foot firmly on the same step. 3. Hold onto the wall or a railing for balance. 4. Slowly lift your other foot, allowing your body weight to press your heel down over the edge of the step. You should feel a stretch in your left / right calf. 5. Hold this position for __________ seconds. 6. Return both feet to the step. 7. Repeat this exercise with a slight bend in your left / right knee. Repeat __________ times with your left / right knee straight and __________ times with your left / right knee bent. Complete this exercise __________ times a day. Balance exercise This exercise builds your balance and strength control of your arch to help take pressure off your plantar fascia. Exercise E: Single leg stand 1. Without shoes, stand near a railing or in a doorway. You may hold onto the railing or door frame as needed. 2. Stand on your left / right foot. Keep your big toe down on the floor and try to keep your arch lifted. Do not let your foot roll inward. 3. Hold this position for  __________ seconds. 4. If this exercise is too easy, you can try it with your eyes closed or while standing on a pillow. Repeat __________ times. Complete this exercise __________ times a day. This information is not intended to replace advice given to you by your health care provider. Make sure you discuss any questions you have with your health care provider. Document Released: 06/08/2005 Document Revised: 02/11/2016 Document Reviewed: 04/22/2015 Elsevier Interactive Patient  Education  2017 [employer].

## 2016-07-27 NOTE — Progress Notes (Signed)
Pre visit review using our clinic review tool, if applicable. No additional management support is needed unless otherwise documented below in the visit note. 

## 2016-07-28 DIAGNOSIS — E878 Other disorders of electrolyte and fluid balance, not elsewhere classified: Secondary | ICD-10-CM | POA: Insufficient documentation

## 2016-07-28 DIAGNOSIS — F32A Depression, unspecified: Secondary | ICD-10-CM | POA: Insufficient documentation

## 2016-07-28 DIAGNOSIS — F329 Major depressive disorder, single episode, unspecified: Secondary | ICD-10-CM | POA: Insufficient documentation

## 2016-07-28 DIAGNOSIS — F419 Anxiety disorder, unspecified: Secondary | ICD-10-CM

## 2016-07-28 DIAGNOSIS — M79671 Pain in right foot: Secondary | ICD-10-CM | POA: Insufficient documentation

## 2016-07-29 ENCOUNTER — Ambulatory Visit (INDEPENDENT_AMBULATORY_CARE_PROVIDER_SITE_OTHER): Payer: 59 | Admitting: *Deleted

## 2016-07-29 DIAGNOSIS — J309 Allergic rhinitis, unspecified: Secondary | ICD-10-CM | POA: Diagnosis not present

## 2016-08-06 ENCOUNTER — Ambulatory Visit (INDEPENDENT_AMBULATORY_CARE_PROVIDER_SITE_OTHER): Payer: 59 | Admitting: *Deleted

## 2016-08-06 DIAGNOSIS — J309 Allergic rhinitis, unspecified: Secondary | ICD-10-CM

## 2016-08-14 ENCOUNTER — Ambulatory Visit (INDEPENDENT_AMBULATORY_CARE_PROVIDER_SITE_OTHER): Payer: 59

## 2016-08-14 DIAGNOSIS — J309 Allergic rhinitis, unspecified: Secondary | ICD-10-CM

## 2016-08-27 ENCOUNTER — Ambulatory Visit (INDEPENDENT_AMBULATORY_CARE_PROVIDER_SITE_OTHER): Payer: 59 | Admitting: *Deleted

## 2016-08-27 DIAGNOSIS — J309 Allergic rhinitis, unspecified: Secondary | ICD-10-CM | POA: Diagnosis not present

## 2016-09-10 ENCOUNTER — Ambulatory Visit (INDEPENDENT_AMBULATORY_CARE_PROVIDER_SITE_OTHER): Payer: 59

## 2016-09-10 DIAGNOSIS — J309 Allergic rhinitis, unspecified: Secondary | ICD-10-CM

## 2016-09-14 ENCOUNTER — Telehealth: Payer: Self-pay | Admitting: *Deleted

## 2016-09-14 NOTE — Telephone Encounter (Signed)
Pt would like a return call regarding a bill she received. She is wanting a break down of the charges.

## 2016-09-14 NOTE — Telephone Encounter (Signed)
Left message explaining dates of service that went to her ded - told her to let me know if she needs a printout

## 2016-09-15 ENCOUNTER — Telehealth: Payer: Self-pay | Admitting: Pediatrics

## 2016-09-15 NOTE — Telephone Encounter (Signed)
Error

## 2016-09-21 ENCOUNTER — Ambulatory Visit (INDEPENDENT_AMBULATORY_CARE_PROVIDER_SITE_OTHER): Payer: 59 | Admitting: *Deleted

## 2016-09-21 DIAGNOSIS — J309 Allergic rhinitis, unspecified: Secondary | ICD-10-CM | POA: Diagnosis not present

## 2016-09-23 ENCOUNTER — Encounter: Payer: Self-pay | Admitting: Nurse Practitioner

## 2016-09-23 LAB — RPR TITER (REFLEX): RPR: NONREACTIVE

## 2016-09-23 LAB — HIV AG/AB COMBO WITH REFLEX: HIV Ag/Ab Combo: NONREACTIVE

## 2016-09-23 LAB — HEPATITIS C ANTIBODY: Hepatitis C Ab: NEGATIVE

## 2016-09-23 LAB — HSV 2 ANTIBODY, IGG
HSV II IgG,Type Spec: <0.9
HSV TYPE 2 IGM ANTIBODIES: NONREACTIVE

## 2016-09-23 LAB — HEPATITIS B E ANTIGEN: Hepatitis B Surface Ag: NEGATIVE

## 2016-09-23 NOTE — Progress Notes (Signed)
Normal results

## 2016-09-24 DIAGNOSIS — J301 Allergic rhinitis due to pollen: Secondary | ICD-10-CM | POA: Diagnosis not present

## 2016-10-06 ENCOUNTER — Ambulatory Visit (INDEPENDENT_AMBULATORY_CARE_PROVIDER_SITE_OTHER): Payer: 59 | Admitting: *Deleted

## 2016-10-06 DIAGNOSIS — J309 Allergic rhinitis, unspecified: Secondary | ICD-10-CM

## 2016-10-23 ENCOUNTER — Ambulatory Visit (INDEPENDENT_AMBULATORY_CARE_PROVIDER_SITE_OTHER): Payer: 59

## 2016-10-23 DIAGNOSIS — J309 Allergic rhinitis, unspecified: Secondary | ICD-10-CM | POA: Diagnosis not present

## 2016-11-03 ENCOUNTER — Ambulatory Visit (INDEPENDENT_AMBULATORY_CARE_PROVIDER_SITE_OTHER): Payer: 59 | Admitting: *Deleted

## 2016-11-03 DIAGNOSIS — J309 Allergic rhinitis, unspecified: Secondary | ICD-10-CM

## 2016-11-19 ENCOUNTER — Ambulatory Visit (INDEPENDENT_AMBULATORY_CARE_PROVIDER_SITE_OTHER): Payer: 59 | Admitting: *Deleted

## 2016-11-19 DIAGNOSIS — J309 Allergic rhinitis, unspecified: Secondary | ICD-10-CM

## 2016-11-24 ENCOUNTER — Ambulatory Visit (INDEPENDENT_AMBULATORY_CARE_PROVIDER_SITE_OTHER): Payer: 59 | Admitting: *Deleted

## 2016-11-24 DIAGNOSIS — J309 Allergic rhinitis, unspecified: Secondary | ICD-10-CM | POA: Diagnosis not present

## 2016-12-03 ENCOUNTER — Ambulatory Visit (INDEPENDENT_AMBULATORY_CARE_PROVIDER_SITE_OTHER): Payer: 59 | Admitting: *Deleted

## 2016-12-03 DIAGNOSIS — J309 Allergic rhinitis, unspecified: Secondary | ICD-10-CM

## 2016-12-08 ENCOUNTER — Ambulatory Visit (INDEPENDENT_AMBULATORY_CARE_PROVIDER_SITE_OTHER): Payer: 59 | Admitting: *Deleted

## 2016-12-08 DIAGNOSIS — J309 Allergic rhinitis, unspecified: Secondary | ICD-10-CM

## 2016-12-14 ENCOUNTER — Encounter: Payer: Self-pay | Admitting: Family Medicine

## 2016-12-14 ENCOUNTER — Ambulatory Visit (INDEPENDENT_AMBULATORY_CARE_PROVIDER_SITE_OTHER): Payer: 59 | Admitting: Family Medicine

## 2016-12-14 VITALS — BP 104/64 | HR 66 | Temp 98.3°F | Resp 12 | Ht 62.0 in | Wt 134.0 lb

## 2016-12-14 DIAGNOSIS — Z3009 Encounter for other general counseling and advice on contraception: Secondary | ICD-10-CM

## 2016-12-14 DIAGNOSIS — B001 Herpesviral vesicular dermatitis: Secondary | ICD-10-CM

## 2016-12-14 MED ORDER — VALACYCLOVIR HCL 500 MG PO TABS: 500.0000 mg | ORAL_TABLET | Freq: Two times a day (BID) | ORAL | 5 refills | Status: DC

## 2016-12-14 NOTE — Progress Notes (Signed)
   Subjective:    Patient ID: April Woodard, female    DOB: 01-Oct-1990, 26 y.o.   MRN: [id number]  HPI This is a 26 yo female with rash on genitals x today. Started with itch, some pain. No pain or irritation prior to seeing rash. Concerned that might be HPV lesion. Has had HPV vaccine.  Has history HSV 1, had one outbreak three years ago. Had a prescription for valacyclovir which she took today (thinks she took 2x 500 mg, they were expired).  Not using contraception, took Plan B last week and started bleeding.   No past medical history on file. No past surgical history on file. Family History  Problem Relation Age of Onset  . Asthma Mother   . Multiple sclerosis Mother   . Allergic rhinitis Mother    Social History  Substance Use Topics  . Smoking status: Never Smoker  . Smokeless tobacco: Never Used  . Alcohol use Yes    Review of Systems perHPI    Objective:   Physical Exam  Constitutional: She is oriented to person, place, and time. She appears well-developed and well-nourished.  HENT:  Head: Normocephalic and atraumatic.  Cardiovascular: Normal rate.   Pulmonary/Chest: Effort normal.  Genitourinary:     Neurological: She is alert and oriented to person, place, and time.  Skin: Skin is warm and dry.  Psychiatric: She has a normal mood and affect. Her behavior is normal. Judgment and thought content normal.  Vitals reviewed.     BP 104/64 (BP Location: Left Arm, Patient Position: Sitting, Cuff Size: Normal)   Pulse 66   Temp 98.3 F (36.8 C) (Oral)   Resp 12   Ht 5\' 2"  (1.575 m)   Wt 134 lb (60.8 kg)   LMP 11/28/2016   SpO2 100%   BMI 24.51 kg/m      Assessment & Plan:  1. Herpes simplex labialis - valACYclovir (VALTREX) 500 MG tablet; Take 1 tablet (500 mg total) by mouth 2 (two) times daily.  Dispense: 6 tablet; Refill: 5  2. General counselling and advice on contraception - provided information about various methods of  contraception   Clarene Reamer, FNP-BC  [facility]  12/14/2016 3:04 PM

## 2016-12-14 NOTE — Patient Instructions (Signed)
Contraception Choices Contraception (birth control) is the use of any methods or devices to prevent pregnancy. Below are some methods to help avoid pregnancy. Hormonal methods  Contraceptive implant. This is a thin, plastic tube containing progesterone hormone. It does not contain estrogen hormone. Your health care provider inserts the tube in the inner part of the upper arm. The tube can remain in place for up to 3 years. After 3 years, the implant must be removed. The implant prevents the ovaries from releasing an egg (ovulation), thickens the cervical mucus to prevent sperm from entering the uterus, and thins the lining of the inside of the uterus.  Progesterone-only injections. These injections are given every 3 months by your health care provider to prevent pregnancy. This synthetic progesterone hormone stops the ovaries from releasing eggs. It also thickens cervical mucus and changes the uterine lining. This makes it harder for sperm to survive in the uterus.  Birth control pills. These pills contain estrogen and progesterone hormone. They work by preventing the ovaries from releasing eggs (ovulation). They also cause the cervical mucus to thicken, preventing the sperm from entering the uterus. Birth control pills are prescribed by a health care provider.Birth control pills can also be used to treat heavy periods.  Minipill. This type of birth control pill contains only the progesterone hormone. They are taken every day of each month and must be prescribed by your health care provider.  Birth control patch. The patch contains hormones similar to those in birth control pills. It must be changed once a week and is prescribed by a health care provider.  Vaginal ring. The ring contains hormones similar to those in birth control pills. It is left in the vagina for 3 weeks, removed for 1 week, and then a new one is put back in place. The patient must be comfortable inserting and removing the ring from  the vagina.A health care provider's prescription is necessary.  Emergency contraception. Emergency contraceptives prevent pregnancy after unprotected sexual intercourse. This pill can be taken right after sex or up to 5 days after unprotected sex. It is most effective the sooner you take the pills after having sexual intercourse. Most emergency contraceptive pills are available without a prescription. Check with your pharmacist. Do not use emergency contraception as your only form of birth control. Barrier methods  Female condom. This is a thin sheath (latex or rubber) that is worn over the penis during sexual intercourse. It can be used with spermicide to increase effectiveness.  Female condom. This is a soft, loose-fitting sheath that is put into the vagina before sexual intercourse.  Diaphragm. This is a soft, latex, dome-shaped barrier that must be fitted by a health care provider. It is inserted into the vagina, along with a spermicidal jelly. It is inserted before intercourse. The diaphragm should be left in the vagina for 6 to 8 hours after intercourse.  Cervical cap. This is a round, soft, latex or plastic cup that fits over the cervix and must be fitted by a health care provider. The cap can be left in place for up to 48 hours after intercourse.  Sponge. This is a soft, circular piece of polyurethane foam. The sponge has spermicide in it. It is inserted into the vagina after wetting it and before sexual intercourse.  Spermicides. These are chemicals that kill or block sperm from entering the cervix and uterus. They come in the form of creams, jellies, suppositories, foam, or tablets. They do not require a prescription. They   are inserted into the vagina with an applicator before having sexual intercourse. The process must be repeated every time you have sexual intercourse. Intrauterine contraception  Intrauterine device (IUD). This is a T-shaped device that is put in a woman's uterus during  a menstrual period to prevent pregnancy. There are 2 types: ? Copper IUD. This type of IUD is wrapped in copper wire and is placed inside the uterus. Copper makes the uterus and fallopian tubes produce a fluid that kills sperm. It can stay in place for 10 years. ? Hormone IUD. This type of IUD contains the hormone progestin (synthetic progesterone). The hormone thickens the cervical mucus and prevents sperm from entering the uterus, and it also thins the uterine lining to prevent implantation of a fertilized egg. The hormone can weaken or kill the sperm that get into the uterus. It can stay in place for 3-5 years, depending on which type of IUD is used. Permanent methods of contraception  Female tubal ligation. This is when the woman's fallopian tubes are surgically sealed, tied, or blocked to prevent the egg from traveling to the uterus.  Hysteroscopic sterilization. This involves placing a small coil or insert into each fallopian tube. Your doctor uses a technique called hysteroscopy to do the procedure. The device causes scar tissue to form. This results in permanent blockage of the fallopian tubes, so the sperm cannot fertilize the egg. It takes about 3 months after the procedure for the tubes to become blocked. You must use another form of birth control for these 3 months.  Female sterilization. This is when the female has the tubes that carry sperm tied off (vasectomy).This blocks sperm from entering the vagina during sexual intercourse. After the procedure, the man can still ejaculate fluid (semen). Natural planning methods  Natural family planning. This is not having sexual intercourse or using a barrier method (condom, diaphragm, cervical cap) on days the woman could become pregnant.  Calendar method. This is keeping track of the length of each menstrual cycle and identifying when you are fertile.  Ovulation method. This is avoiding sexual intercourse during ovulation.  Symptothermal method.  This is avoiding sexual intercourse during ovulation, using a thermometer and ovulation symptoms.  Post-ovulation method. This is timing sexual intercourse after you have ovulated. Regardless of which type or method of contraception you choose, it is important that you use condoms to protect against the transmission of sexually transmitted infections (STIs). Talk with your health care provider about which form of contraception is most appropriate for you. This information is not intended to replace advice given to you by your health care provider. Make sure you discuss any questions you have with your health care provider. Document Released: 06/08/2005 Document Revised: 11/14/2015 Document Reviewed: 12/01/2012 Elsevier Interactive Patient Education  2017 [employer]  

## 2016-12-15 ENCOUNTER — Ambulatory Visit (INDEPENDENT_AMBULATORY_CARE_PROVIDER_SITE_OTHER): Payer: 59 | Admitting: *Deleted

## 2016-12-15 DIAGNOSIS — J309 Allergic rhinitis, unspecified: Secondary | ICD-10-CM

## 2016-12-30 ENCOUNTER — Ambulatory Visit (INDEPENDENT_AMBULATORY_CARE_PROVIDER_SITE_OTHER): Payer: 59 | Admitting: *Deleted

## 2016-12-30 DIAGNOSIS — J309 Allergic rhinitis, unspecified: Secondary | ICD-10-CM | POA: Diagnosis not present

## 2017-01-14 ENCOUNTER — Ambulatory Visit (INDEPENDENT_AMBULATORY_CARE_PROVIDER_SITE_OTHER): Payer: 59 | Admitting: *Deleted

## 2017-01-14 DIAGNOSIS — J309 Allergic rhinitis, unspecified: Secondary | ICD-10-CM | POA: Diagnosis not present

## 2017-01-27 ENCOUNTER — Ambulatory Visit (INDEPENDENT_AMBULATORY_CARE_PROVIDER_SITE_OTHER): Payer: 59

## 2017-01-27 DIAGNOSIS — J309 Allergic rhinitis, unspecified: Secondary | ICD-10-CM

## 2017-02-04 NOTE — Progress Notes (Signed)
VIALS EXP 02-05-18 

## 2017-02-05 DIAGNOSIS — J301 Allergic rhinitis due to pollen: Secondary | ICD-10-CM | POA: Diagnosis not present

## 2017-02-09 ENCOUNTER — Ambulatory Visit (INDEPENDENT_AMBULATORY_CARE_PROVIDER_SITE_OTHER): Payer: 59 | Admitting: *Deleted

## 2017-02-09 DIAGNOSIS — J309 Allergic rhinitis, unspecified: Secondary | ICD-10-CM | POA: Diagnosis not present
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
## 2017-02-24 ENCOUNTER — Ambulatory Visit (INDEPENDENT_AMBULATORY_CARE_PROVIDER_SITE_OTHER): Payer: 59 | Admitting: *Deleted

## 2017-02-24 DIAGNOSIS — J309 Allergic rhinitis, unspecified: Secondary | ICD-10-CM

## 2017-03-10 ENCOUNTER — Ambulatory Visit (INDEPENDENT_AMBULATORY_CARE_PROVIDER_SITE_OTHER): Payer: 59

## 2017-03-10 DIAGNOSIS — J309 Allergic rhinitis, unspecified: Secondary | ICD-10-CM

## 2017-03-23 ENCOUNTER — Ambulatory Visit (INDEPENDENT_AMBULATORY_CARE_PROVIDER_SITE_OTHER): Payer: 59 | Admitting: *Deleted

## 2017-03-23 DIAGNOSIS — J309 Allergic rhinitis, unspecified: Secondary | ICD-10-CM | POA: Diagnosis not present

## 2017-04-07 ENCOUNTER — Ambulatory Visit (INDEPENDENT_AMBULATORY_CARE_PROVIDER_SITE_OTHER): Payer: 59 | Admitting: *Deleted

## 2017-04-07 DIAGNOSIS — J309 Allergic rhinitis, unspecified: Secondary | ICD-10-CM | POA: Diagnosis not present

## 2017-04-12 ENCOUNTER — Ambulatory Visit (INDEPENDENT_AMBULATORY_CARE_PROVIDER_SITE_OTHER): Payer: 59

## 2017-04-12 DIAGNOSIS — J309 Allergic rhinitis, unspecified: Secondary | ICD-10-CM | POA: Diagnosis not present

## 2017-04-22 ENCOUNTER — Ambulatory Visit (INDEPENDENT_AMBULATORY_CARE_PROVIDER_SITE_OTHER): Payer: 59

## 2017-04-22 DIAGNOSIS — J309 Allergic rhinitis, unspecified: Secondary | ICD-10-CM | POA: Diagnosis not present

## 2017-04-28 ENCOUNTER — Ambulatory Visit (INDEPENDENT_AMBULATORY_CARE_PROVIDER_SITE_OTHER): Payer: 59

## 2017-04-28 DIAGNOSIS — J309 Allergic rhinitis, unspecified: Secondary | ICD-10-CM | POA: Diagnosis not present

## 2017-05-11 ENCOUNTER — Ambulatory Visit (INDEPENDENT_AMBULATORY_CARE_PROVIDER_SITE_OTHER): Payer: 59

## 2017-05-11 DIAGNOSIS — J309 Allergic rhinitis, unspecified: Secondary | ICD-10-CM | POA: Diagnosis not present

## 2017-05-26 ENCOUNTER — Ambulatory Visit (INDEPENDENT_AMBULATORY_CARE_PROVIDER_SITE_OTHER): Payer: 59

## 2017-05-26 DIAGNOSIS — J309 Allergic rhinitis, unspecified: Secondary | ICD-10-CM | POA: Diagnosis not present

## 2017-06-08 ENCOUNTER — Ambulatory Visit (INDEPENDENT_AMBULATORY_CARE_PROVIDER_SITE_OTHER): Payer: 59 | Admitting: *Deleted

## 2017-06-08 DIAGNOSIS — J309 Allergic rhinitis, unspecified: Secondary | ICD-10-CM

## 2017-06-23 ENCOUNTER — Ambulatory Visit (INDEPENDENT_AMBULATORY_CARE_PROVIDER_SITE_OTHER): Payer: 59 | Admitting: *Deleted

## 2017-06-23 DIAGNOSIS — J309 Allergic rhinitis, unspecified: Secondary | ICD-10-CM | POA: Diagnosis not present

## 2017-06-24 NOTE — Progress Notes (Signed)
VIALS EXP 06-25-18

## 2017-06-25 DIAGNOSIS — J301 Allergic rhinitis due to pollen: Secondary | ICD-10-CM | POA: Diagnosis not present

## 2017-07-09 ENCOUNTER — Ambulatory Visit (INDEPENDENT_AMBULATORY_CARE_PROVIDER_SITE_OTHER): Payer: 59

## 2017-07-09 DIAGNOSIS — J309 Allergic rhinitis, unspecified: Secondary | ICD-10-CM

## 2017-07-13 ENCOUNTER — Ambulatory Visit (INDEPENDENT_AMBULATORY_CARE_PROVIDER_SITE_OTHER): Payer: 59 | Admitting: *Deleted

## 2017-07-13 DIAGNOSIS — J309 Allergic rhinitis, unspecified: Secondary | ICD-10-CM | POA: Diagnosis not present

## 2017-07-22 ENCOUNTER — Ambulatory Visit (INDEPENDENT_AMBULATORY_CARE_PROVIDER_SITE_OTHER): Payer: 59 | Admitting: *Deleted

## 2017-07-22 DIAGNOSIS — J309 Allergic rhinitis, unspecified: Secondary | ICD-10-CM

## 2017-07-28 ENCOUNTER — Ambulatory Visit (INDEPENDENT_AMBULATORY_CARE_PROVIDER_SITE_OTHER): Payer: 59

## 2017-07-28 DIAGNOSIS — J309 Allergic rhinitis, unspecified: Secondary | ICD-10-CM

## 2017-08-02 ENCOUNTER — Ambulatory Visit (INDEPENDENT_AMBULATORY_CARE_PROVIDER_SITE_OTHER): Payer: 59 | Admitting: *Deleted

## 2017-08-02 DIAGNOSIS — J309 Allergic rhinitis, unspecified: Secondary | ICD-10-CM | POA: Diagnosis not present

## 2017-08-16 ENCOUNTER — Ambulatory Visit (INDEPENDENT_AMBULATORY_CARE_PROVIDER_SITE_OTHER): Payer: 59

## 2017-08-16 DIAGNOSIS — J309 Allergic rhinitis, unspecified: Secondary | ICD-10-CM

## 2017-08-30 ENCOUNTER — Ambulatory Visit (INDEPENDENT_AMBULATORY_CARE_PROVIDER_SITE_OTHER): Payer: 59 | Admitting: *Deleted

## 2017-08-30 DIAGNOSIS — J309 Allergic rhinitis, unspecified: Secondary | ICD-10-CM | POA: Diagnosis not present

## 2017-09-09 ENCOUNTER — Ambulatory Visit (INDEPENDENT_AMBULATORY_CARE_PROVIDER_SITE_OTHER): Payer: 59

## 2017-09-09 DIAGNOSIS — J309 Allergic rhinitis, unspecified: Secondary | ICD-10-CM | POA: Diagnosis not present

## 2017-09-20 LAB — HM PAP SMEAR: HM Pap smear: NEGATIVE

## 2017-09-23 ENCOUNTER — Ambulatory Visit (INDEPENDENT_AMBULATORY_CARE_PROVIDER_SITE_OTHER): Payer: 59 | Admitting: *Deleted

## 2017-09-23 DIAGNOSIS — J309 Allergic rhinitis, unspecified: Secondary | ICD-10-CM

## 2017-10-06 ENCOUNTER — Encounter: Payer: Self-pay | Admitting: *Deleted

## 2017-10-06 NOTE — Progress Notes (Signed)
Maintenance vial made. Exp: 10-07-18. hv 

## 2017-10-07 ENCOUNTER — Ambulatory Visit (INDEPENDENT_AMBULATORY_CARE_PROVIDER_SITE_OTHER): Payer: 59 | Admitting: *Deleted

## 2017-10-07 DIAGNOSIS — J309 Allergic rhinitis, unspecified: Secondary | ICD-10-CM | POA: Diagnosis not present

## 2017-10-13 DIAGNOSIS — J301 Allergic rhinitis due to pollen: Secondary | ICD-10-CM | POA: Diagnosis not present

## 2017-10-20 ENCOUNTER — Ambulatory Visit (INDEPENDENT_AMBULATORY_CARE_PROVIDER_SITE_OTHER): Payer: 59

## 2017-10-20 DIAGNOSIS — J309 Allergic rhinitis, unspecified: Secondary | ICD-10-CM | POA: Diagnosis not present

## 2017-11-02 ENCOUNTER — Ambulatory Visit (INDEPENDENT_AMBULATORY_CARE_PROVIDER_SITE_OTHER): Payer: 59 | Admitting: *Deleted

## 2017-11-02 DIAGNOSIS — J309 Allergic rhinitis, unspecified: Secondary | ICD-10-CM

## 2017-11-12 ENCOUNTER — Ambulatory Visit (INDEPENDENT_AMBULATORY_CARE_PROVIDER_SITE_OTHER): Payer: 59

## 2017-11-12 DIAGNOSIS — J309 Allergic rhinitis, unspecified: Secondary | ICD-10-CM | POA: Diagnosis not present

## 2017-11-22 ENCOUNTER — Ambulatory Visit (INDEPENDENT_AMBULATORY_CARE_PROVIDER_SITE_OTHER): Payer: 59

## 2017-11-22 DIAGNOSIS — J309 Allergic rhinitis, unspecified: Secondary | ICD-10-CM | POA: Diagnosis not present

## 2017-12-01 ENCOUNTER — Ambulatory Visit (INDEPENDENT_AMBULATORY_CARE_PROVIDER_SITE_OTHER): Payer: 59

## 2017-12-01 DIAGNOSIS — J309 Allergic rhinitis, unspecified: Secondary | ICD-10-CM | POA: Diagnosis not present

## 2017-12-10 ENCOUNTER — Ambulatory Visit (INDEPENDENT_AMBULATORY_CARE_PROVIDER_SITE_OTHER): Payer: 59

## 2017-12-10 DIAGNOSIS — J309 Allergic rhinitis, unspecified: Secondary | ICD-10-CM | POA: Diagnosis not present

## 2017-12-29 ENCOUNTER — Ambulatory Visit (INDEPENDENT_AMBULATORY_CARE_PROVIDER_SITE_OTHER): Payer: 59 | Admitting: *Deleted

## 2017-12-29 DIAGNOSIS — J309 Allergic rhinitis, unspecified: Secondary | ICD-10-CM

## 2018-01-19 ENCOUNTER — Ambulatory Visit (INDEPENDENT_AMBULATORY_CARE_PROVIDER_SITE_OTHER): Payer: 59 | Admitting: *Deleted

## 2018-01-19 DIAGNOSIS — J309 Allergic rhinitis, unspecified: Secondary | ICD-10-CM | POA: Diagnosis not present

## 2018-02-08 ENCOUNTER — Ambulatory Visit (INDEPENDENT_AMBULATORY_CARE_PROVIDER_SITE_OTHER): Payer: 59 | Admitting: *Deleted

## 2018-02-08 DIAGNOSIS — J309 Allergic rhinitis, unspecified: Secondary | ICD-10-CM

## 2018-02-16 DIAGNOSIS — J3089 Other allergic rhinitis: Secondary | ICD-10-CM | POA: Diagnosis not present

## 2018-02-16 NOTE — Progress Notes (Signed)
Vials exp 02-17-19

## 2018-03-03 ENCOUNTER — Ambulatory Visit (INDEPENDENT_AMBULATORY_CARE_PROVIDER_SITE_OTHER): Payer: 59 | Admitting: *Deleted

## 2018-03-03 DIAGNOSIS — J309 Allergic rhinitis, unspecified: Secondary | ICD-10-CM

## 2018-03-09 ENCOUNTER — Ambulatory Visit (INDEPENDENT_AMBULATORY_CARE_PROVIDER_SITE_OTHER): Payer: 59 | Admitting: Allergy and Immunology

## 2018-03-09 ENCOUNTER — Encounter: Payer: Self-pay | Admitting: Nurse Practitioner

## 2018-03-09 ENCOUNTER — Ambulatory Visit (INDEPENDENT_AMBULATORY_CARE_PROVIDER_SITE_OTHER): Payer: 59 | Admitting: Nurse Practitioner

## 2018-03-09 ENCOUNTER — Encounter: Payer: Self-pay | Admitting: Allergy and Immunology

## 2018-03-09 VITALS — BP 96/60 | HR 57 | Resp 16 | Ht 62.0 in | Wt 132.0 lb

## 2018-03-09 VITALS — BP 108/78 | HR 62 | Ht 62.0 in | Wt 132.0 lb

## 2018-03-09 DIAGNOSIS — F419 Anxiety disorder, unspecified: Secondary | ICD-10-CM

## 2018-03-09 DIAGNOSIS — F329 Major depressive disorder, single episode, unspecified: Secondary | ICD-10-CM

## 2018-03-09 DIAGNOSIS — J3089 Other allergic rhinitis: Secondary | ICD-10-CM

## 2018-03-09 DIAGNOSIS — E282 Polycystic ovarian syndrome: Secondary | ICD-10-CM | POA: Diagnosis not present

## 2018-03-09 DIAGNOSIS — L503 Dermatographic urticaria: Secondary | ICD-10-CM | POA: Diagnosis not present

## 2018-03-09 DIAGNOSIS — J309 Allergic rhinitis, unspecified: Secondary | ICD-10-CM

## 2018-03-09 DIAGNOSIS — F32A Depression, unspecified: Secondary | ICD-10-CM

## 2018-03-09 DIAGNOSIS — H1010 Acute atopic conjunctivitis, unspecified eye: Secondary | ICD-10-CM

## 2018-03-09 MED ORDER — ESCITALOPRAM OXALATE 10 MG PO TABS: 10.0000 mg | ORAL_TABLET | Freq: Every day | ORAL | 1 refills | Status: DC

## 2018-03-09 MED ORDER — EPINEPHRINE 0.3 MG/0.3ML IJ SOAJ: 0.3000 mg | Freq: Once | INTRAMUSCULAR | 2 refills | Status: AC

## 2018-03-09 MED ORDER — LORATADINE 10 MG PO TABS: 10.0000 mg | ORAL_TABLET | Freq: Every day | ORAL | 5 refills | Status: AC | PRN

## 2018-03-09 NOTE — Assessment & Plan Note (Addendum)
Discussed treatment of anxiety and depression including therapy, prn vs daily medications-risks, benefits, side effects, she remains hesitant to start daily medication but due to her experiencing symptoms almost daily I do not believe she would benefit from PRN medication and a daily medication such as SSRI would be more appropriate treatment, which was discussed in length She is agreeable to 1 month trial of SSRI- lexapro sent, dosing and side effects discussed Continue regular F/U with counseling Additional education provided on AVS - escitalopram (LEXAPRO) 10 MG tablet; Take 1 tablet (10 mg total) by mouth daily.  Dispense: 30 tablet; Refill: 1

## 2018-03-09 NOTE — Patient Instructions (Addendum)
  1.  Continue immunotherapy and Auvi-Q 0.3  2.  Can use OTC Nasacort 1 spray each nostril 1 time per day during periods of upper airway symptoms  3.  Can use OTC antihistamine -Zyrtec/Allegra/Claritin if needed  4.  Obtain fall flu vaccine  5.  Return to clinic in 1 year or earlier if problem

## 2018-03-09 NOTE — Progress Notes (Signed)
Follow-up Note  Referring Provider: Flossie Buffy, NP Primary Provider: Lance Sell, NP Date of Office Visit: 03/09/2018  Subjective:   April Woodard (DOB: 25-Oct-1990) is a 27 y.o. female who returns to the [facility] on 03/09/2018 in re-evaluation of the following:  HPI: April Woodard returns to this clinic in reevaluation of her allergic rhinoconjunctivitis and history of LPR.  Her last visit to this clinic was 08 October 2015.  She is undergoing a course of immunotherapy currently at every 3 weeks.  This has resulted in dramatic improvement regarding all of her atopic symptomatology involving eyes and nose and she does not really need to use antihistamines or nasal steroids at this point in time and can go through each season of the year without any difficulty.  She has developed dermatographia.  She describes these red streaks that will develop if she scratches her skin or she gets pressure on her skin.  This ha developed since May and is not associated with any other systemic or constitutional symptoms.  There is no obvious provoking factor giving rise to this issue.  However, it should be noted that she started medroxyprogesterone at the same point in time in which her dermatographia develop.  She is using medroxyprogesterone for PCOS.  Fortunately, her dermatographia appears to have improved significantly in the past 2 weeks.  Allergies as of 03/09/2018   No Known Allergies     Medication List      EPINEPHrine 0.3 mg/0.3 mL Soaj injection Commonly known as:  EPI-PEN Use as directed for a severe allergic reaction.   loratadine 10 MG tablet Commonly known as:  CLARITIN Take 10 mg by mouth daily as needed for allergies.   medroxyPROGESTERone 10 MG tablet Commonly known as:  PROVERA TK 1 T PO QD FOR 10 DAYS EACH MONTH   valACYclovir 500 MG tablet Commonly known as:  VALTREX Take 1 tablet (500 mg total) by mouth 2 (two) times daily.        Past Medical History:  Diagnosis Date  . Basal cell carcinoma   . PCOS (polycystic ovarian syndrome)     Past Surgical History:  Procedure Laterality Date  . BASAL CELL CARCINOMA EXCISION  12/20/2012   nose    Review of systems negative except as noted in HPI / PMHx or noted below:  Review of Systems  Constitutional: Negative.   HENT: Negative.   Eyes: Negative.   Respiratory: Negative.   Cardiovascular: Negative.   Gastrointestinal: Negative.   Genitourinary: Negative.   Musculoskeletal: Negative.   Skin: Negative.   Neurological: Negative.   Endo/Heme/Allergies: Negative.   Psychiatric/Behavioral: Negative.      Objective:   Vitals:   03/09/18 1007  BP: 96/60  Pulse: (!) 57  Resp: 16  SpO2: 97%   Height: 5\' 2"  (157.5 cm)  Weight: 132 lb (59.9 kg)   Physical Exam  HENT:  Head: Normocephalic.  Right Ear: Tympanic membrane, external ear and ear canal normal.  Left Ear: Tympanic membrane, external ear and ear canal normal.  Nose: Nose normal. No mucosal edema or rhinorrhea.  Mouth/Throat: Uvula is midline, oropharynx is clear and moist and mucous membranes are normal. No oropharyngeal exudate.  Eyes: Conjunctivae are normal.  Neck: Trachea normal. No tracheal tenderness present. No tracheal deviation present. No thyromegaly present.  Cardiovascular: Normal rate, regular rhythm, S1 normal, S2 normal and normal heart sounds.  No murmur heard. Pulmonary/Chest: Breath sounds normal. No stridor. No respiratory distress. She  has no wheezes. She has no rales.  Musculoskeletal: She exhibits no edema.  Lymphadenopathy:       Head (right side): No tonsillar adenopathy present.       Head (left side): No tonsillar adenopathy present.    She has no cervical adenopathy.  Neurological: She is alert.  Skin: No rash noted. She is not diaphoretic. No erythema. Nails show no clubbing.    Diagnostics: none  Assessment and Plan:   1. Perennial allergic rhinitis    2. Dermatographia     1.  Continue immunotherapy and Auvi-Q 0.3  2.  Can use OTC Nasacort 1 spray each nostril 1 time per day during periods of upper airway symptoms  3.  Can use OTC antihistamine -Zyrtec/Allegra/Claritin if needed  4.  Obtain fall flu vaccine  5.  Return to clinic in 1 year or earlier if problem  [place] appears to be doing well with immunotherapy and she will continue on this form of therapy for at least 3 years of maintenance immunotherapy.  She certainly has a collection of other medical therapy that she can use as needed to address this issue of dermatographia.  Because her dermatographia is improving I am not going to have her undergo any further evaluation or treatment for this issue at this point and assume that this will slowly burnout in time.  I suspect that this is probably secondary to her medroxyprogesterone use and I have given her an article about alternatives for treating PCOS which she can discuss with her gynecologist.  Allena Katz, MD [facility]

## 2018-03-09 NOTE — Assessment & Plan Note (Signed)
Continue regular F/U with GYN

## 2018-03-09 NOTE — Progress Notes (Signed)
Name: April Woodard   MRN: [id number]    DOB: 1991-01-10   Date:03/10/2018       Progress Note  Subjective  Chief Complaint Establish care  HPI April Woodard is here today to establish care as a transfer from another provider in our practice. Aside from primary care, she routinely follows with allergist for allergic rhinitis, GYN for womens healthcare and recent diagnosis of PCOS, and therapist for anxiety and depression. She is not maintained on any daily medications by her prior PCP. She would like to discuss anxiety and depression today, which has seemed to be worse for her this year. She says she has suffered from anxiety and depression for some time now, has been working regularly with a Social worker and has never been maintained on medications but has managed her symptoms with various coping strategies, relaxation, breathing,however shes notice this year shes had a harder time dealing with anxiety and depression as she normally does. She feels restless, down, worries too much almost every day. She is currently working full time, grad school part time, which she knows is contributing to her stress. Her therapist recommended she start medication, but shes not sure she wants to start medication. No SI, HI    Patient Active Problem List   Diagnosis Date Noted  . PCOS (polycystic ovarian syndrome) 03/09/2018  . Anxiety and depression 07/28/2016  . Allergic rhinoconjunctivitis 05/08/2015  . LPRD (laryngopharyngeal reflux disease) 05/08/2015    Past Surgical History:  Procedure Laterality Date  . BASAL CELL CARCINOMA EXCISION  12/20/2012   nose    Family History  Problem Relation Age of Onset  . Asthma Mother   . Multiple sclerosis Mother   . Allergic rhinitis Mother   . Hyperlipidemia Father     Social History   Socioeconomic History  . Marital status: Single    Spouse name: Not on file  . Number of children: Not on file  . Years of education: Not on file  . Highest education  level: Not on file  Occupational History  . Not on file  Social Needs  . Financial resource strain: Not on file  . Food insecurity:    Worry: Not on file    Inability: Not on file  . Transportation needs:    Medical: Not on file    Non-medical: Not on file  Tobacco Use  . Smoking status: Never Smoker  . Smokeless tobacco: Never Used  Substance and Sexual Activity  . Alcohol use: Yes  . Drug use: Never  . Sexual activity: Not on file  Lifestyle  . Physical activity:    Days per week: Not on file    Minutes per session: Not on file  . Stress: Not on file  Relationships  . Social connections:    Talks on phone: Not on file    Gets together: Not on file    Attends religious service: Not on file    Active member of club or organization: Not on file    Attends meetings of clubs or organizations: Not on file    Relationship status: Not on file  . Intimate partner violence:    Fear of current or ex partner: Not on file    Emotionally abused: Not on file    Physically abused: Not on file    Forced sexual activity: Not on file  Other Topics Concern  . Not on file  Social History Narrative  . Not on file     Current Outpatient Medications:  .  EPINEPHrine (AUVI-Q) 0.3 mg/0.3 mL IJ SOAJ injection, Inject 0.3 mLs (0.3 mg total) into the muscle once for 1 dose. As directed for life-threatening allergic reactions, Disp: 4 Device, Rfl: 2 .  loratadine (CLARITIN) 10 MG tablet, Take 1 tablet (10 mg total) by mouth daily as needed for allergies., Disp: 30 tablet, Rfl: 5 .  medroxyPROGESTERone (PROVERA) 10 MG tablet, TK 1 T PO QD FOR 10 DAYS EACH MONTH, Disp: , Rfl: 11 .  valACYclovir (VALTREX) 500 MG tablet, Take 1 tablet (500 mg total) by mouth 2 (two) times daily. (Patient taking differently: Take 500 mg by mouth as needed. ), Disp: 6 tablet, Rfl: 5 .  escitalopram (LEXAPRO) 10 MG tablet, Take 1 tablet (10 mg total) by mouth daily., Disp: 30 tablet, Rfl: 1  No Known  Allergies   ROS See HPI  Objective  Vitals:   03/09/18 1509  BP: 108/78  Pulse: 62  SpO2: 99%  Weight: 132 lb (59.9 kg)  Height: 5\' 2"  (1.575 m)    Body mass index is 24.14 kg/m.  Physical Exam Vital signs reviewed. Constitutional: Patient appears well-developed and well-nourished. No distress.  HENT: Head: Normocephalic and atraumatic.   Nose: Nose normal. Mouth/Throat: Oropharynx is clear and moist. No oropharyngeal exudate.  Eyes: Conjunctivae and EOM are normal. Pupils are equal, round, and reactive to light. No scleral icterus.  Neck: Normal range of motion. Neck supple.  Cardiovascular: Normal rate, regular rhythm and normal heart sounds.  No murmur heard. No BLE edema. Distal pulses intact. Pulmonary/Chest: Effort normal and breath sounds normal. No respiratory distress. Musculoskeletal: Normal range of motion, No gross deformities Neurological: SHe is alert and oriented to person, place, and time. No cranial nerve deficit. Coordination, balance, strength, speech and gait are normal.  Skin: Skin is warm and dry. No rash noted. No erythema.  Psychiatric: Patient has a normal mood and affect. behavior is normal. Judgment and thought content normal.   Assessment & Plan RTC in 1 month for F/U: anxiety and depression-starting lexapro  -Reviewed Health Maintenance: declines influenza vacc today

## 2018-03-09 NOTE — Patient Instructions (Signed)
I have sent a prescription for lexapro 10mg  tablets to your pharmacy. Please start 1/2 tablet once daily for 1 week and then increase to a full tablet once daily on week two as tolerated.  Some side effects such as nausea, drowsiness and weight gain can occur.  Also rarely people have experienced suicidal thoughts when taking this medication.  Please discontinue the medication and go directly to ED if this occurs.  Id like to see you back in about 1 month to evaluate progress.     Living With Anxiety After being diagnosed with an anxiety disorder, you may be relieved to know why you have felt or behaved a certain way. It is natural to also feel overwhelmed about the treatment ahead and what it will mean for your life. With care and support, you can manage this condition and recover from it. How to cope with anxiety Dealing with stress Stress is your body's reaction to life changes and events, both good and bad. Stress can last just a few hours or it can be ongoing. Stress can play a major role in anxiety, so it is important to learn both how to cope with stress and how to think about it differently. Talk with your health care provider or a counselor to learn more about stress reduction. He or she may suggest some stress reduction techniques, such as:  Music therapy. This can include creating or listening to music that you enjoy and that inspires you.  Mindfulness-based meditation. This involves being aware of your normal breaths, rather than trying to control your breathing. It can be done while sitting or walking.  Centering prayer. This is a kind of meditation that involves focusing on a word, phrase, or sacred image that is meaningful to you and that brings you peace.  Deep breathing. To do this, expand your stomach and inhale slowly through your nose. Hold your breath for 3-5 seconds. Then exhale slowly, allowing your stomach muscles to relax.  Self-talk. This is a skill where you identify  thought patterns that lead to anxiety reactions and correct those thoughts.  Muscle relaxation. This involves tensing muscles then relaxing them.  Choose a stress reduction technique that fits your lifestyle and personality. Stress reduction techniques take time and practice. Set aside 5-15 minutes a day to do them. Therapists can offer training in these techniques. The training may be covered by some insurance plans. Other things you can do to manage stress include:  Keeping a stress diary. This can help you learn what triggers your stress and ways to control your response.  Thinking about how you respond to certain situations. You may not be able to control everything, but you can control your reaction.  Making time for activities that help you relax, and not feeling guilty about spending your time in this way.  Therapy combined with coping and stress-reduction skills provides the best chance for successful treatment. Medicines Medicines can help ease symptoms. Medicines for anxiety include:  Anti-anxiety drugs.  Antidepressants.  Beta-blockers.  Medicines may be used as the main treatment for anxiety disorder, along with therapy, or if other treatments are not working. Medicines should be prescribed by a health care provider. Relationships Relationships can play a big part in helping you recover. Try to spend more time connecting with trusted friends and family members. Consider going to couples counseling, taking family education classes, or going to family therapy. Therapy can help you and others better understand the condition. How to recognize changes in  your condition Everyone has a different response to treatment for anxiety. Recovery from anxiety happens when symptoms decrease and stop interfering with your daily activities at home or work. This may mean that you will start to:  Have better concentration and focus.  Sleep better.  Be less irritable.  Have more  energy.  Have improved memory.  It is important to recognize when your condition is getting worse. Contact your health care provider if your symptoms interfere with home or work and you do not feel like your condition is improving. Where to find help and support: You can get help and support from these sources:  Self-help groups.  Online and [employer].  A trusted spiritual leader.  Couples counseling.  Family education classes.  Family therapy.  Follow these instructions at home:  Eat a healthy diet that includes plenty of vegetables, fruits, whole grains, low-fat dairy products, and lean protein. Do not eat a lot of foods that are high in solid fats, added sugars, or salt.  Exercise. Most adults should do the following: ? Exercise for at least 150 minutes each week. The exercise should increase your heart rate and make you sweat (moderate-intensity exercise). ? Strengthening exercises at least twice a week.  Cut down on caffeine, tobacco, alcohol, and other potentially harmful substances.  Get the right amount and quality of sleep. Most adults need 7-9 hours of sleep each night.  Make choices that simplify your life.  Take over-the-counter and prescription medicines only as told by your health care provider.  Avoid caffeine, alcohol, and certain over-the-counter cold medicines. These may make you feel worse. Ask your pharmacist which medicines to avoid.  Keep all follow-up visits as told by your health care provider. This is important. Questions to ask your health care provider  Would I benefit from therapy?  How often should I follow up with a health care provider?  How long do I need to take medicine?  Are there any long-term side effects of my medicine?  Are there any alternatives to taking medicine? Contact a health care provider if:  You have a hard time staying focused or finishing daily tasks.  You spend many hours a day feeling worried  about everyday life.  You become exhausted by worry.  You start to have headaches, feel tense, or have nausea.  You urinate more than normal.  You have diarrhea. Get help right away if:  You have a racing heart and shortness of breath.  You have thoughts of hurting yourself or others. If you ever feel like you may hurt yourself or others, or have thoughts about taking your own life, get help right away. You can go to your nearest emergency department or call:  Your local emergency services (911 in the [place]  A suicide crisis helpline, such as the [facility] at 2291925668. This is open 24-hours a day.  Summary  Taking steps to deal with stress can help calm you.  Medicines cannot cure anxiety disorders, but they can help ease symptoms.  Family, friends, and partners can play a big part in helping you recover from an anxiety disorder. This information is not intended to replace advice given to you by your health care provider. Make sure you discuss any questions you have with your health care provider. Document Released: 06/02/2016 Document Revised: 06/02/2016 Document Reviewed: 06/02/2016 Elsevier Interactive Patient Education  [employer].

## 2018-03-09 NOTE — Assessment & Plan Note (Signed)
Stable Continue regular F/U with allergist

## 2018-03-10 ENCOUNTER — Encounter: Payer: Self-pay | Admitting: Allergy and Immunology

## 2018-03-10 ENCOUNTER — Encounter: Payer: Self-pay | Admitting: Nurse Practitioner

## 2018-03-21 ENCOUNTER — Ambulatory Visit (INDEPENDENT_AMBULATORY_CARE_PROVIDER_SITE_OTHER): Payer: 59 | Admitting: *Deleted

## 2018-03-21 DIAGNOSIS — J309 Allergic rhinitis, unspecified: Secondary | ICD-10-CM | POA: Diagnosis not present

## 2018-04-07 ENCOUNTER — Ambulatory Visit (INDEPENDENT_AMBULATORY_CARE_PROVIDER_SITE_OTHER): Payer: 59 | Admitting: Nurse Practitioner

## 2018-04-07 ENCOUNTER — Encounter: Payer: Self-pay | Admitting: Nurse Practitioner

## 2018-04-07 VITALS — BP 118/70 | HR 68 | Ht 62.0 in | Wt 133.0 lb

## 2018-04-07 DIAGNOSIS — F329 Major depressive disorder, single episode, unspecified: Secondary | ICD-10-CM | POA: Diagnosis not present

## 2018-04-07 DIAGNOSIS — F419 Anxiety disorder, unspecified: Secondary | ICD-10-CM

## 2018-04-07 DIAGNOSIS — F32A Depression, unspecified: Secondary | ICD-10-CM

## 2018-04-07 DIAGNOSIS — B001 Herpesviral vesicular dermatitis: Secondary | ICD-10-CM

## 2018-04-07 MED ORDER — VALACYCLOVIR HCL 500 MG PO TABS: 500.0000 mg | ORAL_TABLET | Freq: Two times a day (BID) | ORAL | 5 refills | Status: AC

## 2018-04-07 NOTE — Assessment & Plan Note (Signed)
Continue valtrex prn for recurrent episodes F/U for new symptoms or increased recurrences - valACYclovir (VALTREX) 500 MG tablet; Take 1 tablet (500 mg total) by mouth 2 (two) times daily.  Dispense: 6 tablet; Refill: 5

## 2018-04-07 NOTE — Assessment & Plan Note (Signed)
Continue lexapro at current dosage We discussed at least 6 month trial, if she is feeling well in about 6 months we could try to wean her back off and see how she does, as she really does not want to be on this medication long term Continue regular F/U with counselor F/U in 3 months, sooner for new or worsening symptoms

## 2018-04-07 NOTE — Progress Notes (Signed)
April Woodard is a 27 y.o. female with the following history as recorded in [employer]:  Patient Active Problem List   Diagnosis Date Noted  . PCOS (polycystic ovarian syndrome) 03/09/2018  . Anxiety and depression 07/28/2016  . Allergic rhinoconjunctivitis 05/08/2015  . LPRD (laryngopharyngeal reflux disease) 05/08/2015    Current Outpatient Medications  Medication Sig Dispense Refill  . escitalopram (LEXAPRO) 10 MG tablet Take 1 tablet (10 mg total) by mouth daily. 30 tablet 1  . loratadine (CLARITIN) 10 MG tablet Take 1 tablet (10 mg total) by mouth daily as needed for allergies. 30 tablet 5  . medroxyPROGESTERone (PROVERA) 10 MG tablet TK 1 T PO QD FOR 10 DAYS EACH MONTH  11  . valACYclovir (VALTREX) 500 MG tablet Take 1 tablet (500 mg total) by mouth 2 (two) times daily. 6 tablet 5  . EPINEPHrine (AUVI-Q) 0.3 mg/0.3 mL IJ SOAJ injection Inject 0.3 mLs (0.3 mg total) into the muscle once for 1 dose. As directed for life-threatening allergic reactions 4 Device 2   No current facility-administered medications for this visit.     Allergies: Patient has no known allergies.  Past Medical History:  Diagnosis Date  . Basal cell carcinoma   . PCOS (polycystic ovarian syndrome)     Past Surgical History:  Procedure Laterality Date  . BASAL CELL CARCINOMA EXCISION  12/20/2012   nose    Family History  Problem Relation Age of Onset  . Asthma Mother   . Multiple sclerosis Mother   . Allergic rhinitis Mother   . Hyperlipidemia Father     Social History   Tobacco Use  . Smoking status: Never Smoker  . Smokeless tobacco: Never Used  Substance Use Topics  . Alcohol use: Yes     Subjective:  April Woodard is here today for F/U of anxiety and depression, also requesting refill of valtrex for herpes labialis. At her last OV on 03/09/18, she told me that she had been experiencing symptoms of anxiety and depression for years, working with a counselor for some time but had never been on  medications, symptoms seeming to get worse with recent increase in work and school load. We decided to trial lexapro 10 to see if this helped her mood. She tells me today she has been taking lexapro as prescribed, now at 10 daily, at first noticed headaches and feeling tired but headaches have resolved and she has been taking the medication at night. She does feel the lexapro has helped her mood, feeling less restless and worried, and would like to continue for now, but hopes she can try to wean back off at some point. She has also continued F/U with counselor. She has had Rx for valtrex prn from another providre for acute cold sores, has only needed to use the Rx twice in 2 years, does not currently have symptoms but would like refill in case she needs it, current Rx has expired No loss of appetite, insomnia, SI, HI.     ROS- See HPI  Objective:  Vitals:   04/07/18 0759  BP: 118/70  Pulse: 68  SpO2: 99%  Weight: 133 lb (60.3 kg)  Height: 5\' 2"  (1.575 m)    General: Well developed, well nourished, in no acute distress  Skin : Warm and dry.  Head: Normocephalic and atraumatic  Eyes: Sclera and conjunctiva clear; pupils round and reactive to light; extraocular movements intact  Oropharynx: Pink, supple. Neck: Supple Lungs: Respirations unlabored; clear to auscultation bilaterally without wheeze, rales,  rhonchi  CVS exam: normal rate and regular rhythm, S1 and S2 normal.  Extremities: No edema, cyanosis Vessels: Symmetric bilaterally  Neurologic: Alert and oriented; speech intact; face symmetrical; moves all extremities well; CNII-XII intact without focal deficit   Physical Exam   Assessment:  1. Anxiety and depression   2. Herpes simplex labialis     Plan:   Return in about 3 months (around 07/08/2018) for CPE, F/U- anxiety and depression.  No orders of the defined types were placed in this encounter.   Requested Prescriptions   Signed Prescriptions Disp Refills  . valACYclovir  (VALTREX) 500 MG tablet 6 tablet 5    Sig: Take 1 tablet (500 mg total) by mouth 2 (two) times daily.

## 2018-04-13 ENCOUNTER — Ambulatory Visit (INDEPENDENT_AMBULATORY_CARE_PROVIDER_SITE_OTHER): Payer: 59

## 2018-04-13 DIAGNOSIS — J309 Allergic rhinitis, unspecified: Secondary | ICD-10-CM | POA: Diagnosis not present

## 2018-05-04 ENCOUNTER — Ambulatory Visit (INDEPENDENT_AMBULATORY_CARE_PROVIDER_SITE_OTHER): Payer: 59

## 2018-05-04 DIAGNOSIS — J309 Allergic rhinitis, unspecified: Secondary | ICD-10-CM | POA: Diagnosis not present

## 2018-05-06 ENCOUNTER — Other Ambulatory Visit: Payer: Self-pay | Admitting: Nurse Practitioner

## 2018-05-06 DIAGNOSIS — F32A Depression, unspecified: Secondary | ICD-10-CM

## 2018-05-06 DIAGNOSIS — F329 Major depressive disorder, single episode, unspecified: Secondary | ICD-10-CM

## 2018-05-06 DIAGNOSIS — F419 Anxiety disorder, unspecified: Principal | ICD-10-CM

## 2018-05-12 ENCOUNTER — Ambulatory Visit (INDEPENDENT_AMBULATORY_CARE_PROVIDER_SITE_OTHER): Payer: 59 | Admitting: *Deleted

## 2018-05-12 DIAGNOSIS — J309 Allergic rhinitis, unspecified: Secondary | ICD-10-CM | POA: Diagnosis not present

## 2018-05-16 ENCOUNTER — Ambulatory Visit (INDEPENDENT_AMBULATORY_CARE_PROVIDER_SITE_OTHER): Payer: 59 | Admitting: *Deleted

## 2018-05-16 DIAGNOSIS — J309 Allergic rhinitis, unspecified: Secondary | ICD-10-CM | POA: Diagnosis not present

## 2018-05-25 ENCOUNTER — Ambulatory Visit (INDEPENDENT_AMBULATORY_CARE_PROVIDER_SITE_OTHER): Payer: 59 | Admitting: *Deleted

## 2018-05-25 DIAGNOSIS — J309 Allergic rhinitis, unspecified: Secondary | ICD-10-CM

## 2018-05-31 ENCOUNTER — Ambulatory Visit (INDEPENDENT_AMBULATORY_CARE_PROVIDER_SITE_OTHER): Payer: 59 | Admitting: *Deleted

## 2018-05-31 DIAGNOSIS — J309 Allergic rhinitis, unspecified: Secondary | ICD-10-CM | POA: Diagnosis not present

## 2018-06-30 ENCOUNTER — Ambulatory Visit (INDEPENDENT_AMBULATORY_CARE_PROVIDER_SITE_OTHER): Payer: 59 | Admitting: *Deleted

## 2018-06-30 DIAGNOSIS — J309 Allergic rhinitis, unspecified: Secondary | ICD-10-CM | POA: Diagnosis not present

## 2018-07-02 ENCOUNTER — Other Ambulatory Visit: Payer: Self-pay | Admitting: Nurse Practitioner

## 2018-07-02 DIAGNOSIS — F419 Anxiety disorder, unspecified: Principal | ICD-10-CM

## 2018-07-02 DIAGNOSIS — F329 Major depressive disorder, single episode, unspecified: Secondary | ICD-10-CM

## 2018-07-02 DIAGNOSIS — F32A Depression, unspecified: Secondary | ICD-10-CM

## 2018-07-14 ENCOUNTER — Encounter: Payer: Self-pay | Admitting: Nurse Practitioner

## 2018-07-14 ENCOUNTER — Other Ambulatory Visit (INDEPENDENT_AMBULATORY_CARE_PROVIDER_SITE_OTHER): Payer: 59

## 2018-07-14 ENCOUNTER — Ambulatory Visit (INDEPENDENT_AMBULATORY_CARE_PROVIDER_SITE_OTHER): Payer: 59 | Admitting: Nurse Practitioner

## 2018-07-14 VITALS — BP 110/80 | HR 63 | Ht 62.0 in | Wt 137.0 lb

## 2018-07-14 DIAGNOSIS — F32A Depression, unspecified: Secondary | ICD-10-CM

## 2018-07-14 DIAGNOSIS — F329 Major depressive disorder, single episode, unspecified: Secondary | ICD-10-CM

## 2018-07-14 DIAGNOSIS — Z0000 Encounter for general adult medical examination without abnormal findings: Secondary | ICD-10-CM | POA: Diagnosis not present

## 2018-07-14 DIAGNOSIS — F419 Anxiety disorder, unspecified: Secondary | ICD-10-CM | POA: Diagnosis not present

## 2018-07-14 DIAGNOSIS — E282 Polycystic ovarian syndrome: Secondary | ICD-10-CM

## 2018-07-14 LAB — COMPREHENSIVE METABOLIC PANEL
ALT: 55 U/L — ABNORMAL HIGH (ref 0–35)
AST: 38 U/L — AB (ref 0–37)
Albumin: 4.5 g/dL (ref 3.5–5.2)
Alkaline Phosphatase: 39 U/L (ref 39–117)
BILIRUBIN TOTAL: 0.4 mg/dL (ref 0.2–1.2)
BUN: 10 mg/dL (ref 6–23)
CHLORIDE: 105 meq/L (ref 96–112)
CO2: 27 meq/L (ref 19–32)
Calcium: 9.3 mg/dL (ref 8.4–10.5)
Creatinine, Ser: 0.8 mg/dL (ref 0.40–1.20)
GFR: 85.4 mL/min (ref >60.00)
GLUCOSE: 95 mg/dL (ref 70–99)
Potassium: 4.2 mEq/L (ref 3.5–5.1)
Sodium: 139 mEq/L (ref 135–145)
Total Protein: 7.3 g/dL (ref 6.0–8.3)

## 2018-07-14 LAB — CBC
HEMATOCRIT: 38.6 % (ref 36.0–46.0)
Hemoglobin: 13.1 g/dL (ref 12.0–15.0)
MCHC: 33.9 g/dL (ref 30.0–36.0)
MCV: 94.6 fl (ref 78.0–100.0)
PLATELETS: 297 10*3/uL (ref 150.0–400.0)
RBC: 4.08 Mil/uL (ref 3.87–5.11)
RDW: 12.7 % (ref 11.5–15.5)
WBC: 4.5 10*3/uL (ref 4.0–10.5)

## 2018-07-14 LAB — LIPID PANEL
CHOL/HDL RATIO: 3
Cholesterol: 206 mg/dL — ABNORMAL HIGH (ref 0–200)
HDL: 81.8 mg/dL (ref >39.00)
LDL CALC: 114 mg/dL — AB (ref 0–99)
NONHDL: 124.58
Triglycerides: 52 mg/dL (ref 0.0–149.0)
VLDL: 10.4 mg/dL (ref 0.0–40.0)

## 2018-07-14 LAB — HEMOGLOBIN A1C: Hgb A1c MFr Bld: 5.2 % (ref 4.6–6.5)

## 2018-07-14 NOTE — Assessment & Plan Note (Signed)
Reviewed annual screening exams, healthy lifestyle, additional information provided on AVS Will request GYN records to update pt chart She was encouraged to f/u/establish care with new PCP in [place] for regular care - CBC; Future - Comprehensive metabolic panel; Future - Lipid panel; Future - Hemoglobin A1c; Future

## 2018-07-14 NOTE — Assessment & Plan Note (Signed)
stable Instructions for weaning lexapro given to pt verbally and on AVS She was encouraged to f/u/establish care with new PCP in [place] for continued monitoring of anxiety and depression - CBC; Future - Comprehensive metabolic panel; Future

## 2018-07-14 NOTE — Patient Instructions (Addendum)
Head downstairs for labs today  When you decide to wean your lexapro, you can take 1/2 tablet daily for 2 weeks, then 1/2 tablet every other day for 2 weeks, then stop  When you get to [place], I would recommend establishing care with primary care provider and endocrinology/gynecology for PCOS.  Good luck on your new journey!  Health Maintenance, Female Adopting a healthy lifestyle and getting preventive care can go a long way to promote health and wellness. Talk with your health care provider about what schedule of regular examinations is right for you. This is a good chance for you to check in with your provider about disease prevention and staying healthy. In between checkups, there are plenty of things you can do on your own. Experts have done a lot of research about which lifestyle changes and preventive measures are most likely to keep you healthy. Ask your health care provider for more information. Weight and diet Eat a healthy diet  Be sure to include plenty of vegetables, fruits, low-fat dairy products, and lean protein.  Do not eat a lot of foods high in solid fats, added sugars, or salt.  Get regular exercise. This is one of the most important things you can do for your health. ? Most adults should exercise for at least 150 minutes each week. The exercise should increase your heart rate and make you sweat (moderate-intensity exercise). ? Most adults should also do strengthening exercises at least twice a week. This is in addition to the moderate-intensity exercise. Maintain a healthy weight  Body mass index (BMI) is a measurement that can be used to identify possible weight problems. It estimates body fat based on height and weight. Your health care provider can help determine your BMI and help you achieve or maintain a healthy weight.  For females 32 years of age and older: ? A BMI below 18.5 is considered underweight. ? A BMI of 18.5 to 24.9 is normal. ? A BMI of 25 to  29.9 is considered overweight. ? A BMI of 30 and above is considered obese. Watch levels of cholesterol and blood lipids  You should start having your blood tested for lipids and cholesterol at 28 years of age, then have this test every 5 years.  You may need to have your cholesterol levels checked more often if: ? Your lipid or cholesterol levels are high. ? You are older than 28 years of age. ? You are at high risk for heart disease. Cancer screening Lung Cancer  Lung cancer screening is recommended for adults 17-61 years old who are at high risk for lung cancer because of a history of smoking.  A yearly low-dose CT scan of the lungs is recommended for people who: ? Currently smoke. ? Have quit within the past 15 years. ? Have at least a 30-pack-year history of smoking. A pack year is smoking an average of one pack of cigarettes a day for 1 year.  Yearly screening should continue until it has been 15 years since you quit.  Yearly screening should stop if you develop a health problem that would prevent you from having lung cancer treatment. Breast Cancer  Practice breast self-awareness. This means understanding how your breasts normally appear and feel.  It also means doing regular breast self-exams. Let your health care provider know about any changes, no matter how small.  If you are in your 20s or 30s, you should have a clinical breast exam (CBE) by a health care provider every  1-3 years as part of a regular health exam.  If you are 48 or older, have a CBE every year. Also consider having a breast X-ray (mammogram) every year.  If you have a family history of breast cancer, talk to your health care provider about genetic screening.  If you are at high risk for breast cancer, talk to your health care provider about having an MRI and a mammogram every year.  Breast cancer gene (BRCA) assessment is recommended for women who have family members with BRCA-related cancers.  BRCA-related cancers include: ? Breast. ? Ovarian. ? Tubal. ? Peritoneal cancers.  Results of the assessment will determine the need for genetic counseling and BRCA1 and BRCA2 testing. Cervical Cancer Your health care provider may recommend that you be screened regularly for cancer of the pelvic organs (ovaries, uterus, and vagina). This screening involves a pelvic examination, including checking for microscopic changes to the surface of your cervix (Pap test). You may be encouraged to have this screening done every 3 years, beginning at age 79.  For women ages 82-65, health care providers may recommend pelvic exams and Pap testing every 3 years, or they may recommend the Pap and pelvic exam, combined with testing for human papilloma virus (HPV), every 5 years. Some types of HPV increase your risk of cervical cancer. Testing for HPV may also be done on women of any age with unclear Pap test results.  Other health care providers may not recommend any screening for nonpregnant women who are considered low risk for pelvic cancer and who do not have symptoms. Ask your health care provider if a screening pelvic exam is right for you.  If you have had past treatment for cervical cancer or a condition that could lead to cancer, you need Pap tests and screening for cancer for at least 20 years after your treatment. If Pap tests have been discontinued, your risk factors (such as having a new sexual partner) need to be reassessed to determine if screening should resume. Some women have medical problems that increase the chance of getting cervical cancer. In these cases, your health care provider may recommend more frequent screening and Pap tests. Colorectal Cancer  This type of cancer can be detected and often prevented.  Routine colorectal cancer screening usually begins at 28 years of age and continues through 28 years of age.  Your health care provider may recommend screening at an earlier age if you  have risk factors for colon cancer.  Your health care provider may also recommend using home test kits to check for hidden blood in the stool.  A small camera at the end of a tube can be used to examine your colon directly (sigmoidoscopy or colonoscopy). This is done to check for the earliest forms of colorectal cancer.  Routine screening usually begins at age 64.  Direct examination of the colon should be repeated every 5-10 years through 28 years of age. However, you may need to be screened more often if early forms of precancerous polyps or small growths are found. Skin Cancer  Check your skin from head to toe regularly.  Tell your health care provider about any new moles or changes in moles, especially if there is a change in a mole's shape or color.  Also tell your health care provider if you have a mole that is larger than the size of a pencil eraser.  Always use sunscreen. Apply sunscreen liberally and repeatedly throughout the day.  Protect yourself by wearing  long sleeves, pants, a wide-brimmed hat, and sunglasses whenever you are outside. Heart disease, diabetes, and high blood pressure  High blood pressure causes heart disease and increases the risk of stroke. High blood pressure is more likely to develop in: ? People who have blood pressure in the high end of the normal range (130-139/85-89 mm Hg). ? People who are overweight or obese. ? People who are African American.  If you are 78-40 years of age, have your blood pressure checked every 3-5 years. If you are 21 years of age or older, have your blood pressure checked every year. You should have your blood pressure measured twice-once when you are at a hospital or clinic, and once when you are not at a hospital or clinic. Record the average of the two measurements. To check your blood pressure when you are not at a hospital or clinic, you can use: ? An automated blood pressure machine at a pharmacy. ? A home blood pressure  monitor.  If you are between 17 years and 89 years old, ask your health care provider if you should take aspirin to prevent strokes.  Have regular diabetes screenings. This involves taking a blood sample to check your fasting blood sugar level. ? If you are at a normal weight and have a low risk for diabetes, have this test once every three years after 28 years of age. ? If you are overweight and have a high risk for diabetes, consider being tested at a younger age or more often. Preventing infection Hepatitis B  If you have a higher risk for hepatitis B, you should be screened for this virus. You are considered at high risk for hepatitis B if: ? You were born in a country where hepatitis B is common. Ask your health care provider which countries are considered high risk. ? Your parents were born in a high-risk country, and you have not been immunized against hepatitis B (hepatitis B vaccine). ? You have HIV or AIDS. ? You use needles to inject street drugs. ? You live with someone who has hepatitis B. ? You have had sex with someone who has hepatitis B. ? You get hemodialysis treatment. ? You take certain medicines for conditions, including cancer, organ transplantation, and autoimmune conditions. Hepatitis C  Blood testing is recommended for: ? Everyone born from [birth year] through 1965. ? Anyone with known risk factors for hepatitis C. Sexually transmitted infections (STIs)  You should be screened for sexually transmitted infections (STIs) including gonorrhea and chlamydia if: ? You are sexually active and are younger than 28 years of age. ? You are older than 28 years of age and your health care provider tells you that you are at risk for this type of infection. ? Your sexual activity has changed since you were last screened and you are at an increased risk for chlamydia or gonorrhea. Ask your health care provider if you are at risk.  If you do not have HIV, but are at risk, it may be  recommended that you take a prescription medicine daily to prevent HIV infection. This is called pre-exposure prophylaxis (PrEP). You are considered at risk if: ? You are sexually active and do not regularly use condoms or know the HIV status of your partner(s). ? You take drugs by injection. ? You are sexually active with a partner who has HIV. Talk with your health care provider about whether you are at high risk of being infected with HIV. If you choose to  begin PrEP, you should first be tested for HIV. You should then be tested every 3 months for as long as you are taking PrEP. Pregnancy  If you are premenopausal and you may become pregnant, ask your health care provider about preconception counseling.  If you may become pregnant, take 400 to 800 micrograms (mcg) of folic acid every day.  If you want to prevent pregnancy, talk to your health care provider about birth control (contraception). Osteoporosis and menopause  Osteoporosis is a disease in which the bones lose minerals and strength with aging. This can result in serious bone fractures. Your risk for osteoporosis can be identified using a bone density scan.  If you are 92 years of age or older, or if you are at risk for osteoporosis and fractures, ask your health care provider if you should be screened.  Ask your health care provider whether you should take a calcium or vitamin D supplement to lower your risk for osteoporosis.  Menopause may have certain physical symptoms and risks.  Hormone replacement therapy may reduce some of these symptoms and risks. Talk to your health care provider about whether hormone replacement therapy is right for you. Follow these instructions at home:  Schedule regular health, dental, and eye exams.  Stay current with your immunizations.  Do not use any tobacco products including cigarettes, chewing tobacco, or electronic cigarettes.  If you are pregnant, do not drink alcohol.  If you are  breastfeeding, limit how much and how often you drink alcohol.  Limit alcohol intake to no more than 1 drink per day for nonpregnant women. One drink equals 12 ounces of beer, 5 ounces of wine, or 1 ounces of hard liquor.  Do not use street drugs.  Do not share needles.  Ask your health care provider for help if you need support or information about quitting drugs.  Tell your health care provider if you often feel depressed.  Tell your health care provider if you have ever been abused or do not feel safe at home. This information is not intended to replace advice given to you by your health care provider. Make sure you discuss any questions you have with your health care provider. Document Released: 12/22/2010 Document Revised: 11/14/2015 Document Reviewed: 03/12/2015 Elsevier Interactive Patient Education  2019 [employer].

## 2018-07-14 NOTE — Assessment & Plan Note (Addendum)
She is currently maintained on medication for PCOS by GYN, educated that due to being on medication/time of day, would not order testosterone today because reading would not be accurate She was encouraged to f/u with GYN/endocrinology for routine evaluation/management of PCOS - Lipid panel; Future - Hemoglobin A1c; Future

## 2018-07-14 NOTE — Progress Notes (Signed)
April Woodard is a 28 y.o. female with the following history as recorded in [employer]:  Patient Active Problem List   Diagnosis Date Noted  . Herpes simplex labialis 04/07/2018  . PCOS (polycystic ovarian syndrome) 03/09/2018  . Anxiety and depression 07/28/2016  . Allergic rhinoconjunctivitis 05/08/2015  . LPRD (laryngopharyngeal reflux disease) 05/08/2015    Current Outpatient Medications  Medication Sig Dispense Refill  . escitalopram (LEXAPRO) 10 MG tablet TAKE 1 TABLET(10 MG) BY MOUTH DAILY 30 tablet 1  . loratadine (CLARITIN) 10 MG tablet Take 1 tablet (10 mg total) by mouth daily as needed for allergies. 30 tablet 5  . medroxyPROGESTERone (PROVERA) 10 MG tablet TK 1 T PO QD FOR 10 DAYS EACH MONTH  11  . valACYclovir (VALTREX) 500 MG tablet Take 1 tablet (500 mg total) by mouth 2 (two) times daily. 6 tablet 5  . EPINEPHrine (AUVI-Q) 0.3 mg/0.3 mL IJ SOAJ injection Inject 0.3 mLs (0.3 mg total) into the muscle once for 1 dose. As directed for life-threatening allergic reactions 4 Device 2   No current facility-administered medications for this visit.     Allergies: Patient has no known allergies.  Past Medical History:  Diagnosis Date  . Basal cell carcinoma   . PCOS (polycystic ovarian syndrome)     Past Surgical History:  Procedure Laterality Date  . BASAL CELL CARCINOMA EXCISION  12/20/2012   nose    Family History  Problem Relation Age of Onset  . Asthma Mother   . Multiple sclerosis Mother   . Allergic rhinitis Mother   . Hyperlipidemia Father     Social History   Tobacco Use  . Smoking status: Never Smoker  . Smokeless tobacco: Never Used  Substance Use Topics  . Alcohol use: Yes     Subjective:  April Woodard is here today for CPE, also requesting PCOS testing, testosterone level, done by GYN prior to this but shes not sure shell return to GYN prior to move, shes moving to [place] next month, maintained on provera for PCOS by GYN. She also tells me  she wants to wean off her lexapro around march, this will be 6 months since she started the medication, feels her mood is much better and does not like decreased sex drive she noticed after starting the lexapro.  CPE-  Last dental exam: 2019 Last vision exam: no routine vision exam PAP: up to date by GYN No LMP recorded. Lipids: lipid panel ordered DM screening- hx PCOS, will check A1c Vaccinations: up to date Diet and exercise: working on healthy diet and exercise, low carb diet  Review of Systems  Constitutional: Negative for chills and fever.  HENT: Negative for hearing loss.   Eyes: Negative for blurred vision and double vision.  Respiratory: Negative for cough and shortness of breath.   Cardiovascular: Negative for chest pain and palpitations.  Gastrointestinal: Negative for abdominal pain, constipation, diarrhea, heartburn, nausea and vomiting.  Genitourinary: Negative for dysuria and hematuria.  Musculoskeletal: Negative for falls.  Skin: Negative for rash.  Neurological: Negative for speech change, loss of consciousness and headaches.  Endo/Heme/Allergies: Does not bruise/bleed easily.  Psychiatric/Behavioral: Negative for depression. The patient is not nervous/anxious.     Objective:  Vitals:   07/14/18 0825  BP: 110/80  Pulse: 63  SpO2: 99%  Weight: 137 lb (62.1 kg)  Height: 5\' 2"  (1.575 m)  Body mass index is 25.06 kg/m.   General: Well developed, well nourished, in no acute distress  Skin : Warm  and dry.  Head: Normocephalic and atraumatic  Eyes: Sclera and conjunctiva clear; pupils round and reactive to light; extraocular movements intact  Ears: External normal; canals clear; tympanic membranes normal  Oropharynx: Pink, supple. No suspicious lesions  Neck: Supple without thyromegaly, adenopathy  Lungs: Respirations unlabored; clear to auscultation bilaterally without wheeze, rales, rhonchi  Breasts: defd to GYN CVS exam: normal rate and regular rhythm, S1  and S2 normal.  Abdomen: Soft; nontender; nondistended; no masses or hepatosplenomegaly  Genitourinary: defd to GYN Musculoskeletal: No deformities; no active joint inflammation  Extremities: No edema, cyanosis, clubbing  Vessels: Symmetric bilaterally  Neurologic: Alert and oriented; speech intact; face symmetrical; moves all extremities well; CNII-XII intact without focal deficit  Psychiatric: Normal mood and affect.  Assessment:  1. Routine general medical examination at a health care facility   2. Anxiety and depression   3. PCOS (polycystic ovarian syndrome)     Plan:   No follow-ups on file.  Orders Placed This Encounter  Procedures  . CBC    Standing Status:   Future    Standing Expiration Date:   07/15/2019  . Comprehensive metabolic panel    Standing Status:   Future    Standing Expiration Date:   07/15/2019  . Lipid panel    Standing Status:   Future    Standing Expiration Date:   07/15/2019  . Hemoglobin A1c    Standing Status:   Future    Standing Expiration Date:   07/15/2019    Requested Prescriptions    No prescriptions requested or ordered in this encounter

## 2018-07-15 ENCOUNTER — Encounter: Payer: Self-pay | Admitting: Nurse Practitioner

## 2018-07-18 ENCOUNTER — Other Ambulatory Visit: Payer: Self-pay | Admitting: Family

## 2018-07-18 DIAGNOSIS — R945 Abnormal results of liver function studies: Principal | ICD-10-CM

## 2018-07-18 DIAGNOSIS — R7989 Other specified abnormal findings of blood chemistry: Secondary | ICD-10-CM

## 2018-08-03 ENCOUNTER — Ambulatory Visit (INDEPENDENT_AMBULATORY_CARE_PROVIDER_SITE_OTHER): Payer: 59 | Admitting: *Deleted

## 2018-08-03 DIAGNOSIS — J309 Allergic rhinitis, unspecified: Secondary | ICD-10-CM
